# Patient Record
Sex: Female | Born: 1990 | Race: White | Hispanic: No | Marital: Single | Smoking: Never smoker
Health system: Southern US, Community
[De-identification: ages and names within clinical notes are randomized; demographics above are authoritative.]

## PROBLEM LIST (undated history)

## (undated) DIAGNOSIS — E669 Obesity, unspecified: Secondary | ICD-10-CM

## (undated) DIAGNOSIS — T7840XA Allergy, unspecified, initial encounter: Secondary | ICD-10-CM

---
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
## 2012-01-04 ENCOUNTER — Telehealth: Payer: Self-pay

## 2012-01-04 NOTE — Telephone Encounter (Signed)
LM for pt. To cb re: the message she left yesterday. JO CMA

## 2012-01-06 ENCOUNTER — Telehealth: Payer: Self-pay

## 2012-01-06 NOTE — Telephone Encounter (Signed)
Lm on given # [phone] for pt to call back re: message she left on 12/31/2011. As of 01/06/2012 I have not heard back from her. JO ,CMA.

## 2014-07-23 ENCOUNTER — Encounter (HOSPITAL_COMMUNITY): Payer: Self-pay | Admitting: *Deleted

## 2015-12-09 ENCOUNTER — Encounter: Payer: Self-pay | Admitting: Internal Medicine

## 2015-12-09 ENCOUNTER — Encounter (INDEPENDENT_AMBULATORY_CARE_PROVIDER_SITE_OTHER): Payer: Self-pay

## 2015-12-09 ENCOUNTER — Ambulatory Visit (INDEPENDENT_AMBULATORY_CARE_PROVIDER_SITE_OTHER): Payer: 59 | Admitting: Physician Assistant

## 2015-12-09 VITALS — BP 112/80 | HR 72 | Temp 97.3°F | Resp 16 | Ht 60.0 in | Wt 238.8 lb

## 2015-12-09 DIAGNOSIS — Z13220 Encounter for screening for lipoid disorders: Secondary | ICD-10-CM

## 2015-12-09 DIAGNOSIS — Z79899 Other long term (current) drug therapy: Secondary | ICD-10-CM

## 2015-12-09 DIAGNOSIS — E559 Vitamin D deficiency, unspecified: Secondary | ICD-10-CM

## 2015-12-09 DIAGNOSIS — D649 Anemia, unspecified: Secondary | ICD-10-CM

## 2015-12-09 DIAGNOSIS — Z1389 Encounter for screening for other disorder: Secondary | ICD-10-CM

## 2015-12-09 DIAGNOSIS — Z0000 Encounter for general adult medical examination without abnormal findings: Secondary | ICD-10-CM

## 2015-12-09 DIAGNOSIS — Z131 Encounter for screening for diabetes mellitus: Secondary | ICD-10-CM

## 2015-12-09 DIAGNOSIS — E669 Obesity, unspecified: Secondary | ICD-10-CM

## 2015-12-09 DIAGNOSIS — T7840XA Allergy, unspecified, initial encounter: Secondary | ICD-10-CM | POA: Insufficient documentation

## 2015-12-09 DIAGNOSIS — T7840XD Allergy, unspecified, subsequent encounter: Secondary | ICD-10-CM

## 2015-12-09 DIAGNOSIS — Z87440 Personal history of urinary (tract) infections: Secondary | ICD-10-CM

## 2015-12-09 LAB — CBC WITH DIFFERENTIAL/PLATELET
BASOS ABS: 0 10*3/uL (ref 0.0–0.1)
Basophils Relative: 0 % (ref 0–1)
EOS PCT: 4 % (ref 0–5)
Eosinophils Absolute: 0.4 10*3/uL (ref 0.0–0.7)
HEMATOCRIT: 38.6 % (ref 36.0–46.0)
HEMOGLOBIN: 12.2 g/dL (ref 12.0–15.0)
LYMPHS ABS: 3.6 10*3/uL (ref 0.7–4.0)
LYMPHS PCT: 37 % (ref 12–46)
MCH: 23.5 pg — ABNORMAL LOW (ref 26.0–34.0)
MCHC: 31.6 g/dL (ref 30.0–36.0)
MCV: 74.4 fL — AB (ref 78.0–100.0)
MONOS PCT: 6 % (ref 3–12)
MPV: 8.2 fL — ABNORMAL LOW (ref 8.6–12.4)
Monocytes Absolute: 0.6 10*3/uL (ref 0.1–1.0)
NEUTROS ABS: 5.1 10*3/uL (ref 1.7–7.7)
Neutrophils Relative %: 53 % (ref 43–77)
Platelets: 411 10*3/uL — ABNORMAL HIGH (ref 150–400)
RBC: 5.19 MIL/uL — ABNORMAL HIGH (ref 3.87–5.11)
RDW: 15.6 % — ABNORMAL HIGH (ref 11.5–15.5)
WBC: 9.7 10*3/uL (ref 4.0–10.5)

## 2015-12-09 LAB — HEMOGLOBIN A1C
HEMOGLOBIN A1C: 6 % — AB (ref <5.7)
Mean Plasma Glucose: 126 mg/dL — ABNORMAL HIGH (ref <117)

## 2015-12-09 NOTE — Patient Instructions (Signed)
We want weight loss that will last so you should lose 1-2 pounds a week.  THAT IS IT! Please pick THREE things a month to change. Once it is a habit check off the item. Then pick another three items off the list to become habits.  If you are already doing a habit on the list GREAT!  Cross that item off! o Don't drink your calories. Ie, alcohol, soda, fruit juice, and sweet tea.  o Drink more water. Drink a glass when you feel hungry or before each meal.  o Eat breakfast - Complex carb and protein (likeDannon light and fit yogurt, oatmeal, fruit, eggs, [place] bacon). o Measure your cereal.  Eat no more than one cup a day. (ie [place]) o Eat an apple a day. o Add a vegetable a day. o Try a new vegetable a month. o Use Pam! Stop using oil or butter to cook. o Don't finish your plate or use smaller plates. o Share your dessert. o Eat sugar free Jello for dessert or frozen grapes. o Don't eat 2-3 hours before bed. o Switch to whole wheat bread, pasta, and brown rice. o Make healthier choices when you eat out. No fries! o Pick baked chicken, NOT fried. o Don't forget to SLOW DOWN when you eat. It is not going anywhere.  o Take the stairs. o Park far away in the parking lot o [employer] (or weights) for 10 minutes while watching TV. o Walk at work for 10 minutes during break. o Walk outside 1 time a week with your friend, kids, dog, or significant other. o Start a walking group at church. o Walk the mall as much as you can tolerate.  o Keep a food diary. o Weigh yourself daily. o Walk for 15 minutes 3 days per week. o Cook at home more often and eat out less.  If life happens and you go back to old habits, it is okay.  Just start over. You can do it!   If you experience chest pain, get short of breath, or tired during the exercise, please stop immediately and inform your doctor.      Bad carbs also include fruit juice, alcohol, and sweet tea. These are empty calories that do not signal  to your brain that you are full.   Please remember the good carbs are still carbs which convert into sugar. So please measure them out no more than 1/2-1 cup of rice, oatmeal, pasta, and beans  Veggies are however free foods! Pile them on.   Not all fruit is created equal. Please see the list below, the fruit at the bottom is higher in sugars than the fruit at the top. Please avoid all dried fruits.    Before you even begin to attack a weight-loss plan, it pays to remember this: You are not fat. You have fat. Losing weight isn't about blame or shame; it's simply another achievement to accomplish. Dieting is like any other skill-you have to buckle down and work at it. As long as you act in a smart, reasonable way, you'll ultimately get where you want to be. Here are some weight loss pearls for you.  1. It's Not a Diet. It's a Lifestyle Thinking of a diet as something you're on and suffering through only for the short term doesn't work. To shed weight and keep it off, you need to make permanent changes to the way you eat. It's OK to indulge occasionally, of course, but if  you cut calories temporarily and then revert to your old way of eating, you'll gain back the weight quicker than you can say yo-yo. Use it to lose it. Research shows that one of the best predictors of long-term weight loss is how many pounds you drop in the first month. For that reason, nutritionists often suggest being stricter for the first two weeks of your new eating strategy to build momentum. Cut out added sugar and alcohol and avoid unrefined carbs. After that, figure out how you can reincorporate them in a way that's healthy and maintainable.  2. There's a Right Way to Exercise Working out burns calories and fat and boosts your metabolism by building muscle. But those trying to lose weight are notorious for overestimating the number of calories they burn and underestimating the amount they take in. Unfortunately, your system is  biologically programmed to hold on to extra pounds and that means when you start exercising, your body senses the deficit and ramps up its hunger signals. If you're not diligent, you'll eat everything you burn and then some. Use it to lose it. Cardio gets all the exercise glory, but strength and interval training are the real heroes. They help you build lean muscle, which in turn increases your metabolism and calorie-burning ability 3. Don't Overreact to Mild Hunger Some people have a hard time losing weight because of hunger anxiety. To them, being hungry is bad-something to be avoided at all costs-so they carry snacks with them and eat when they don't need to. Others eat because they're stressed out or bored. While you never want to get to the point of being ravenous (that's when bingeing is likely to happen), a hunger pang, a craving, or the fact that it's 3:00 p.m. should not send you racing for the vending machine or obsessing about the energy bar in your purse. Ideally, you should put off eating until your stomach is growling and it's difficult to concentrate.  Use it to lose it. When you feel the urge to eat, use the HALT method. Ask yourself, Am I really hungry? Or am I angry or anxious, lonely or bored, or tired? If you're still not certain, try the apple test. If you're truly hungry, an apple should seem delicious; if it doesn't, something else is going on. Or you can try drinking water and making yourself busy, if you are still hungry try a healthy snack.  4. Not All Calories Are Created Equal The mechanics of weight loss are pretty simple: Take in fewer calories than you use for energy. But the kind of food you eat makes all the difference. Processed food that's high in saturated fat and refined starch or sugar can cause inflammation that disrupts the hormone signals that tell your brain you're full. The result: You eat a lot more.  Use it to lose it. Clean up your diet. Swap in whole, unprocessed  foods, including vegetables, lean protein, and healthy fats that will fill you up and give you the biggest nutritional bang for your calorie buck. In a few weeks, as your brain starts receiving regular hunger and fullness signals once again, you'll notice that you feel less hungry overall and naturally start cutting back on the amount you eat.  5. Protein, Produce, and Plant-Based Fats Are Your Weight-Loss Trinity Here's why eating the three Ps regularly will help you drop pounds. Protein fills you up. You need it to build lean muscle, which keeps your metabolism humming so that you can torch  more fat. People in a weight-loss program who ate double the recommended daily allowance for protein (about 110 grams for a 150-pound woman) lost 70 percent of their weight from fat, while people who ate the RDA lost only about 40 percent, one study found. Produce is packed with filling fiber. "It's very difficult to consume too many calories if you're eating a lot of vegetables. Example: Three cups of broccoli is a lot of food, yet only 93 calories. (Fruit is another story. It can be easy to overeat and can contain a lot of calories from sugar, so be sure to monitor your intake.) Plant-based fats like olive oil and those in avocados and nuts are healthy and extra satiating.  Use it to lose it. Aim to incorporate each of the three Ps into every meal and snack. People who eat protein throughout the day are able to keep weight off, according to a study in the American Journal of Clinical Nutrition. In addition to meat, poultry and seafood, good sources are beans, lentils, eggs, tofu, and yogurt. As for fat, keep portion sizes in check by measuring out salad dressing, oil, and nut butters (shoot for one to two tablespoons). Finally, eat veggies or a little fruit at every meal. People who did that consumed 308 fewer calories but didn't feel any hungrier than when they didn't eat more produce.  7. How You Eat Is As Important  As What You Eat In order for your brain to register that you're full, you need to focus on what you're eating. Sit down whenever you eat, preferably at a table. Turn off the TV or computer, put down your phone, and look at your food. Smell it. Chew slowly, and don't put another bite on your fork until you swallow. When women ate lunch this attentively, they consumed 30 percent less when snacking later than those who listened to an audiobook at lunchtime, according to a study in the [place] Journal of Nutrition. 8. Weighing Yourself Really Works The scale provides the best evidence about whether your efforts are paying off. Seeing the numbers tick up or down or stagnate is motivation to keep going-or to rethink your approach. A 2015 study at [facility] found that daily weigh-ins helped people lose more weight, keep it off, and maintain that loss, even after two years. Use it to lose it. Step on the scale at the same time every day for the best results. If your weight shoots up several pounds from one weigh-in to the next, don't freak out. Eating a lot of salt the night before or having your period is the likely culprit. The number should return to normal in a day or two. It's a steady climb that you need to do something about. 9. Too Much Stress and Too Little Sleep Are Your Enemies When you're tired and frazzled, your body cranks up the production of cortisol, the stress hormone that can cause carb cravings. Not getting enough sleep also boosts your levels of ghrelin, a hormone associated with hunger, while suppressing leptin, a hormone that signals fullness and satiety. People on a diet who slept only five and a half hours a night for two weeks lost 55 percent less fat and were hungrier than those who slept eight and a half hours, according to a study in the [place] [employer] Journal. Use it to lose it. Prioritize sleep, aiming for seven hours or more a night, which research shows helps  lower stress. And make sure you're getting  quality zzz's. If a snoring spouse or a fidgety cat wakes you up frequently throughout the night, you may end up getting the equivalent of just four hours of sleep, according to a study from [facility]. Keep pets out of the bedroom, and use a white-noise app to drown out snoring. 10. You Will Hit a plateau-And You Can Bust Through It As you slim down, your body releases much less leptin, the fullness hormone.  If you're not strength training, start right now. Building muscle can raise your metabolism to help you overcome a plateau. To keep your body challenged and burning calories, incorporate new moves and more intense intervals into your workouts or add another sweat session to your weekly routine. Alternatively, cut an extra 100 calories or so a day from your diet. Now that you've lost weight, your body simply doesn't need as much fuel.   Ways to cut 100 calories  1. Eat your eggs with hot sauce OR salsa instead of cheese.  Eggs are great for breakfast, but many people consider eggs and cheese to be BFFs. Instead of cheese-1 oz. of cheddar has 114 calories-top your eggs with hot sauce, which contains no calories and helps with satiety and metabolism. Salsa is also a great option!!  2. Top your toast, waffles or pancakes with mashed berries instead of jelly or syrup. Half a cup of berries-fresh, frozen or thawed-has about 40 calories, compared with 2 tbsp. of maple syrup or jelly, which both have about 100 calories. The berries will also give you a good punch of fiber, which helps keep you full and satisfied and won't spike blood sugar quickly like the jelly or syrup. 3. Swap the non-fat latte for black coffee with a splash of half-and-half. Contrary to its name, that non-fat latte has 130 calories and a startling 19g of carbohydrates per 16 oz. serving. Replacing that 'light' drinkable dessert with a black coffee with a splash of half-and-half saves  you more than 100 calories per 16 oz. serving. 4. Sprinkle salads with freeze-dried raspberries instead of dried cranberries. If you want a sweet addition to your nutritious salad, stay away from dried cranberries. They have a whopping 130 calories per  cup and 30g carbohydrates. Instead, sprinkle freeze-dried raspberries guilt-free and save more than 100 calories per  cup serving, adding 3g of belly-filling fiber. 5. Go for mustard in place of mayo on your sandwich. Mustard can add really nice flavor to any sandwich, and there are tons of varieties, from spicy to honey. A serving of mayo is 95 calories, versus 10 calories in a serving of mustard. 6. Choose a DIY salad dressing instead of the store-bought kind. Mix Dijon or whole grain mustard with low-fat Kefir or red wine vinegar and garlic. 7. Use hummus as a spread instead of a dip. Use hummus as a spread on a high-fiber cracker or tortilla with a sandwich and save on calories without sacrificing taste. 8. Pick just one salad "accessory." Salad isn't automatically a calorie winner. It's easy to over-accessorize with toppings. Instead of topping your salad with nuts, avocado and cranberries (all three will clock in at 313 calories), just pick one. The next day, choose a different accessory, which will also keep your salad interesting. You don't wear all your jewelry every day, right? 9. Ditch the white pasta in favor of spaghetti squash. One cup of cooked spaghetti squash has about 40 calories, compared with traditional spaghetti, which comes with more than 200. Spaghetti squash is  also nutrient-dense. It's a good source of fiber and Vitamins A and C, and it can be eaten just like you would eat pasta-with a great tomato sauce and [place] meatballs or with pesto, tofu and spinach, for example. 10. Dress up your chili, soups and stews with non-fat [place] yogurt instead of sour cream. Just a 'dollop' of sour cream can set you back 115 calories and a  whopping 12g of fat-seven of which are of the artery-clogging variety. Added bonus: [place] yogurt is packed with muscle-building protein, calcium and B Vitamins. 11. Mash cauliflower instead of mashed potatoes. One cup of traditional mashed potatoes-in all their creamy goodness-has more than 200 calories, compared to mashed cauliflower, which you can typically eat for less than 100 calories per 1 cup serving. Cauliflower is a great source of the antioxidant indole-3-carbinol (I3C), which may help reduce the risk of some cancers, like breast cancer. 12. Ditch the ice cream sundae in favor of a [place] yogurt parfait. Instead of a cup of ice cream or fro-yo for dessert, try 1 cup of nonfat Greek yogurt topped with fresh berries and a sprinkle of cacao nibs. Both toppings are packed with antioxidants, which can help reduce cellular inflammation and oxidative damage. And the comparison is a no-brainer: One cup of ice cream has about 275 calories; one cup of frozen yogurt has about 230; and a cup of Greek yogurt has just 130, plus twice the protein, so you're less likely to return to the freezer for a second helping. 13. Put olive oil in a spray container instead of using it directly from the bottle. Each tablespoon of olive oil is 120 calories and 15g of fat. Use a mister instead of pouring it straight into the pan or onto a salad. This allows for portion control and will save you more than 100 calories. 14. When baking, substitute canned pumpkin for butter or oil. Canned pumpkin-not pumpkin pie mix-is loaded with Vitamin A, which is important for skin and eye health, as well as immunity. And the comparisons are pretty crazy:  cup of canned pumpkin has about 40 calories, compared to butter or oil, which has more than 800 calories. Yes, 800 calories. Applesauce and mashed banana can also serve as good substitutions for butter or oil, usually in a 1:1 ratio. 15. Top casseroles with high-fiber cereal instead of  breadcrumbs. Breadcrumbs are typically made with white bread, while breakfast cereals contain 5-9g of fiber per serving. Not only will you save more than 150 calories per  cup serving, the swap will also keep you more full and you'll get a metabolism boost from the added fiber. 16. Snack on pistachios instead of macadamia nuts. Believe it or not, you get the same amount of calories from 35 pistachios (100 calories) as you would from only five macadamia nuts. 17. Chow down on kale chips rather than potato chips. This is my favorite 'don't knock it 'till you try it' swap. Kale chips are so easy to make at home, and you can spice them up with a little grated parmesan or chili powder. Plus, they're a mere fraction of the calories of potato chips, but with the same crunch factor we crave so often. 18. Add seltzer and some fruit slices to your cocktail instead of soda or fruit juice. One cup of soda or fruit juice can pack on as much as 140 calories. Instead, use seltzer and fruit slices. The fruit provides valuable phytochemicals, such as flavonoids and anthocyanins, which help to  combat cancer and stave off the aging process.

## 2015-12-09 NOTE — Progress Notes (Signed)
Complete Physical  Assessment and Plan: 1. Obesity Obesity with co morbidities- long discussion about weight loss, diet, and exercise  2. Allergy, subsequent encounter  3. Screening for blood or protein in urine - Microalbumin / creatinine urine ratio  4. Screening cholesterol level - Lipid panel  5. Screening for diabetes mellitus - Hemoglobin A1c - Insulin, fasting  6. History of UTI - Urinalysis, Routine w reflex microscopic ([facility]) - Urine culture  7. Medication management - CBC with Differential/Platelet - BASIC METABOLIC PANEL WITH GFR - TSH - Hepatic function panel - Magnesium  8. Vitamin D deficiency - VITAMIN D 25 Hydroxy (Vit-D Deficiency, Fractures)  9. Anemia, unspecified anemia type - Iron and TIBC - Ferritin - Vitamin B12  . Discussed med's effects and SE's. Screening labs and tests as requested with regular follow-up as recommended. Over 40 minutes of exam, counseling, chart review and critical decision making was performed  HPI  This very nice 24 y.o.female presents for complete physical.  Patient has no major health issues.  Patient reports no complaints at this time.  She will be moving to [place] on the 14th, boyfriend lives there, father of isabella, together x 6 years.  Was on BCP, but is causing irregular menses, lasting very long and heavy menses, following with Dr. Senaida Oresichardson.  X child in Oct 2012, girl, isabella.  She does workout.  Was seen at [facility] for UTI in Jan, no symptoms at this time.  BMI is Body mass index is 46.64 kg/(m^2)., she is working on diet and exercise. Wt Readings from Last 3 Encounters:  12/09/15 238 lb 12.8 oz (108.319 kg)  06/23/11 196 lb (88.905 kg) (97 %*, Z = 1.93)   * Growth percentiles are based on CDC 2-20 Years data.     Current Medications:  No current outpatient prescriptions on file prior to visit.   No current facility-administered medications on file prior to visit.   Health Maintenance:    TD/TDAP: unknown- will check Influenza:  Pneumovax: N/A Prevnar 13: N/A HPV: never but willing to get  LMP: No LMP recorded. Sexually Active: yes STD testing offered Pap: 2016 at [facility], Dr. Roxan Hockeyobinson [facility]:  N/A Last Dental Exam: Last Eye Exam:  Allergies: No Known Allergies Medical History:  Past Medical History  Diagnosis Date  . Asthma   . Obesity   . Allergy    Surgical History:  Past Surgical History  Procedure Laterality Date  . Abcess tooth  2010  . Wisdom teeth  2009  . Cholecystectomy  10/2011   Family History:  Family History  Problem Relation Age of Onset  . Adopted: Yes   Social History:  Social History  Substance Use Topics  . Smoking status: Never Smoker   . Smokeless tobacco: Never Used  . Alcohol Use: 0.0 oz/week    0 Standard drinks or equivalent per week     Comment: once a month   Review of Systems: Review of Systems  Constitutional: Negative.   HENT: Negative.   Eyes: Negative.   Respiratory: Negative.   Cardiovascular: Negative.   Gastrointestinal: Negative.   Genitourinary: Negative.   Musculoskeletal: Negative.   Skin: Negative.   Neurological: Negative.   Endo/Heme/Allergies: Negative.   Psychiatric/Behavioral: Negative.     Physical Exam: Estimated body mass index is 46.64 kg/(m^2) as calculated from the following:   Height as of this encounter: 5' (1.524 m).   Weight as of this encounter: 238 lb 12.8 oz (108.319 kg). BP 112/80 mmHg  Pulse 72  Temp(Src) 97.3 F (36.3 C)  Resp 16  Ht 5' (1.524 m)  Wt 238 lb 12.8 oz (108.319 kg)  BMI 46.64 kg/m2 General Appearance: Well nourished, in no apparent distress.  Eyes: PERRLA, EOMs, conjunctiva no swelling or erythema, normal fundi and vessels.  Sinuses: No Frontal/maxillary tenderness  ENT/Mouth: Ext aud canals clear, normal light reflex with TMs without erythema, bulging. Good dentition. No erythema, swelling, or exudate on post pharynx. Tonsils not swollen or erythematous.  Hearing normal.  Neck: Supple, thyroid normal. No bruits  Respiratory: Respiratory effort normal, BS equal bilaterally without rales, rhonchi, wheezing or stridor.  Cardio: RRR without murmurs, rubs or gallops. Brisk peripheral pulses without edema.  Chest: symmetric, with normal excursions and percussion.  Breasts: defer  Abdomen: Soft, nontender, obese, no guarding, rebound, hernias, masses, or organomegaly.  Lymphatics: Non tender without lymphadenopathy.  Genitourinary: defer Musculoskeletal: Full ROM all peripheral extremities,5/5 strength, and normal gait.  Skin: Warm, dry without rashes, lesions, ecchymosis. Neuro: Cranial nerves intact, reflexes equal bilaterally. Normal muscle tone, no cerebellar symptoms. Sensation intact.  Psych: Awake and oriented X 3, normal affect, Insight and Judgment appropriate.   EKG: defer  Quentin Mulling 12:37 PM [facility]

## 2015-12-10 LAB — LIPID PANEL
Cholesterol: 197 mg/dL (ref 125–200)
HDL: 55 mg/dL (ref >=46)
LDL CALC: 123 mg/dL (ref <130)
Total CHOL/HDL Ratio: 3.6 Ratio (ref <=5.0)
Triglycerides: 97 mg/dL (ref <150)
VLDL: 19 mg/dL (ref <30)

## 2015-12-10 LAB — VITAMIN B12: Vitamin B-12: 584 pg/mL (ref 200–1100)

## 2015-12-10 LAB — MICROALBUMIN / CREATININE URINE RATIO
Creatinine, Urine: 184 mg/dL (ref 20–320)
Microalb Creat Ratio: 3 mcg/mg creat (ref <30)
Microalb, Ur: 0.6 mg/dL

## 2015-12-10 LAB — URINALYSIS, ROUTINE W REFLEX MICROSCOPIC
BILIRUBIN URINE: NEGATIVE
GLUCOSE, UA: NEGATIVE
HGB URINE DIPSTICK: NEGATIVE
Ketones, ur: NEGATIVE
Nitrite: NEGATIVE
PH: 5.5 (ref 5.0–8.0)
PROTEIN: NEGATIVE
Specific Gravity, Urine: 1.028 (ref 1.001–1.035)

## 2015-12-10 LAB — URINE CULTURE

## 2015-12-10 LAB — URINALYSIS, MICROSCOPIC ONLY
CRYSTALS: NONE SEEN [HPF]
Casts: NONE SEEN [LPF]
YEAST: NONE SEEN [HPF]

## 2015-12-10 LAB — IRON AND TIBC
%SAT: 11 % (ref 11–50)
IRON: 39 ug/dL — AB (ref 40–190)
TIBC: 357 ug/dL (ref 250–450)
UIBC: 318 ug/dL (ref 125–400)

## 2015-12-10 LAB — HEPATIC FUNCTION PANEL
ALK PHOS: 91 U/L (ref 33–115)
ALT: 17 U/L (ref 6–29)
AST: 17 U/L (ref 10–30)
Albumin: 4 g/dL (ref 3.6–5.1)
BILIRUBIN DIRECT: 0.1 mg/dL (ref <=0.2)
BILIRUBIN INDIRECT: 0.2 mg/dL (ref 0.2–1.2)
TOTAL PROTEIN: 7.3 g/dL (ref 6.1–8.1)
Total Bilirubin: 0.3 mg/dL (ref 0.2–1.2)

## 2015-12-10 LAB — BASIC METABOLIC PANEL WITH GFR
BUN: 13 mg/dL (ref 7–25)
CHLORIDE: 104 mmol/L (ref 98–110)
CO2: 23 mmol/L (ref 20–31)
Calcium: 9.5 mg/dL (ref 8.6–10.2)
Creat: 0.6 mg/dL (ref 0.50–1.10)
GFR, Est African American: >89 mL/min (ref >=60)
GFR, Est Non African American: >89 mL/min (ref >=60)
GLUCOSE: 89 mg/dL (ref 65–99)
POTASSIUM: 4.4 mmol/L (ref 3.5–5.3)
Sodium: 136 mmol/L (ref 135–146)

## 2015-12-10 LAB — FERRITIN: FERRITIN: 13 ng/mL (ref 10–154)

## 2015-12-10 LAB — INSULIN, FASTING: Insulin fasting, serum: 18 u[IU]/mL (ref 2.0–19.6)

## 2015-12-10 LAB — MAGNESIUM: MAGNESIUM: 2 mg/dL (ref 1.5–2.5)

## 2015-12-10 LAB — VITAMIN D 25 HYDROXY (VIT D DEFICIENCY, FRACTURES): VIT D 25 HYDROXY: 12 ng/mL — AB (ref 30–100)

## 2015-12-10 LAB — TSH: TSH: 2.47 mIU/L

## 2016-09-13 ENCOUNTER — Emergency Department (HOSPITAL_COMMUNITY)
Admission: EM | Admit: 2016-09-13 | Discharge: 2016-09-13 | Disposition: A | Discharge status: Home / Self Care | Payer: BLUE CROSS/BLUE SHIELD | Attending: Emergency Medicine | Admitting: Emergency Medicine

## 2016-09-13 ENCOUNTER — Encounter (HOSPITAL_COMMUNITY): Payer: Self-pay

## 2016-09-13 ENCOUNTER — Emergency Department (HOSPITAL_COMMUNITY): Payer: BLUE CROSS/BLUE SHIELD

## 2016-09-13 DIAGNOSIS — R002 Palpitations: Secondary | ICD-10-CM | POA: Diagnosis present

## 2016-09-13 DIAGNOSIS — F410 Panic disorder [episodic paroxysmal anxiety] without agoraphobia: Secondary | ICD-10-CM | POA: Diagnosis not present

## 2016-09-13 DIAGNOSIS — J45909 Unspecified asthma, uncomplicated: Secondary | ICD-10-CM | POA: Diagnosis not present

## 2016-09-13 LAB — I-STAT CHEM 8, ED
BUN: 15 mg/dL (ref 6–20)
CALCIUM ION: 1.19 mmol/L (ref 1.15–1.40)
CHLORIDE: 105 mmol/L (ref 101–111)
CREATININE: 0.7 mg/dL (ref 0.44–1.00)
GLUCOSE: 109 mg/dL — AB (ref 65–99)
HCT: 32 % — ABNORMAL LOW (ref 36.0–46.0)
Hemoglobin: 10.9 g/dL — ABNORMAL LOW (ref 12.0–15.0)
Potassium: 3.7 mmol/L (ref 3.5–5.1)
SODIUM: 142 mmol/L (ref 135–145)
TCO2: 24 mmol/L (ref 0–100)

## 2016-09-13 LAB — I-STAT TROPONIN, ED: TROPONIN I, POC: 0 ng/mL (ref 0.00–0.08)

## 2016-09-13 NOTE — ED Notes (Signed)
Patient transported to X-ray 

## 2016-09-13 NOTE — ED Triage Notes (Signed)
Pt complaining of mid chest pressure and SOB. Pt states ongoing x 2 days. Pt states feels like heart is beating fast.

## 2016-09-13 NOTE — ED Provider Notes (Signed)
[facility] DEPT Provider Note   CSN: [id number] Arrival date & time: 09/13/16  2138     History   Chief Complaint Chief Complaint  Patient presents with  . Chest Pain  . Shortness of Breath    HPI Teresa Summers is a 25 y.o. female.  The history is provided by the patient.  Palpitations   This is a new problem. The current episode started yesterday. The problem occurs daily. The problem has been resolved. The problem is associated with anxiety (chest tightness). On average, each episode lasts 30 seconds. Pertinent negatives include no near-syncope. Associated symptoms comments: shakiness. She has tried nothing for the symptoms. Risk factors include stress.    Past Medical History:  Diagnosis Date  . Allergy   . Asthma   . Obesity     Patient Active Problem List   Diagnosis Date Noted  . Obesity   . Allergy     Past Surgical History:  Procedure Laterality Date  . abcess tooth  2010  . CHOLECYSTECTOMY  10/2011  . wisdom teeth  2009    OB History    Gravida Para Term Preterm AB Living   1 1 1     1    SAB TAB Ectopic Multiple Live Births           1       Home Medications    Prior to Admission medications   Not on File    Family History Family History  Problem Relation Age of Onset  . Adopted: Yes    Social History Social History  Substance Use Topics  . Smoking status: Never Smoker  . Smokeless tobacco: Never Used  . Alcohol use 0.0 oz/week     Comment: once a month     Allergies   Patient has no known allergies.   Review of Systems Review of Systems  Cardiovascular: Positive for palpitations. Negative for near-syncope.  All other systems reviewed and are negative.    Physical Exam Updated Vital Signs BP 120/79   Pulse 105   Temp 98 F (36.7 C) (Oral)   Resp 22   LMP 09/04/2016 (Approximate)   SpO2 100%   Physical Exam  Constitutional: She is oriented to person, place, and time. She appears well-developed and  well-nourished. No distress.  HENT:  Head: Normocephalic.  Nose: Nose normal.  Eyes: Conjunctivae are normal.  Neck: Neck supple. No tracheal deviation present.  Cardiovascular: Normal rate, regular rhythm and normal heart sounds.   Pulmonary/Chest: Effort normal and breath sounds normal. No respiratory distress.  Abdominal: Soft. She exhibits no distension. There is no tenderness.  Neurological: She is alert and oriented to person, place, and time.  Skin: Skin is warm and dry. Capillary refill takes less than 2 seconds.  Psychiatric: She has a normal mood and affect.     ED Treatments / Results  Labs (all labs ordered are listed, but only abnormal results are displayed) Labs Reviewed  Rosezena SensorI-STAT TROPOININ, ED  I-STAT CHEM 8, ED    EKG  EKG Interpretation  Date/Time:  Sunday September 13 2016 21:43:38 EST Ventricular Rate:  107 PR Interval:  160 QRS Duration: 76 QT Interval:  352 QTC Calculation: 469 R Axis:   55 Text Interpretation:  Sinus tachycardia Otherwise normal ECG Confirmed by Kaylib Furness MD, Alajah Witman [phone]([id number]) on 09/13/2016 9:59:47 PM       Radiology Dg Chest 2 View  Result Date: 09/13/2016 CLINICAL DATA:  Mid chest pain and pressure. EXAM: CHEST  2  VIEW COMPARISON:  None. FINDINGS: The heart size and mediastinal contours are within normal limits. Both lungs are clear. The visualized skeletal structures are unremarkable. IMPRESSION: No active cardiopulmonary disease. Electronically Signed   By: Ted Mcalpineobrinka  Dimitrova M.D.   On: 09/13/2016 22:28    Procedures Procedures (including critical care time)  Medications Ordered in ED Medications - No data to display   Initial Impression / Assessment and Plan / ED Course  I have reviewed the triage vital signs and the nursing notes.  Pertinent labs & imaging results that were available during my care of the patient were reviewed by me and considered in my medical decision making (see chart for details).  Clinical Course      25 y.o. female presents with palpitation feeling and episodes of chest tightness over the last 2 days intermittently. She states she has been under increased stress after breaking up with her significant other and baby's father prior to the holidays. She became emotional and cried during exam when discussing this and had recurrent symptoms on monitoring without arrhythmia or significant tachycardia. Discussed outlets for stress and therapy/PCP f/u for considering medications. Plan to follow up with PCP as needed and return precautions discussed for worsening or new concerning symptoms.   Final Clinical Impressions(s) / ED Diagnoses   Final diagnoses:  Panic attack  Palpitations    New Prescriptions New Prescriptions   No medications on file     Lyndal Pulleyaniel Orville Mena, MD 09/14/16 0100

## 2016-09-17 ENCOUNTER — Ambulatory Visit: Payer: Self-pay | Admitting: Internal Medicine

## 2016-09-22 ENCOUNTER — Ambulatory Visit (INDEPENDENT_AMBULATORY_CARE_PROVIDER_SITE_OTHER): Payer: 59 | Admitting: Internal Medicine

## 2016-09-22 ENCOUNTER — Encounter: Payer: Self-pay | Admitting: Internal Medicine

## 2016-09-22 VITALS — BP 118/86 | HR 88 | Temp 97.3°F | Resp 16 | Ht 60.0 in | Wt 238.2 lb

## 2016-09-22 DIAGNOSIS — F410 Panic disorder [episodic paroxysmal anxiety] without agoraphobia: Secondary | ICD-10-CM | POA: Diagnosis not present

## 2016-09-22 MED ORDER — ALPRAZOLAM 0.5 MG PO TABS: ORAL_TABLET | ORAL | 0 refills | Status: AC

## 2016-09-22 NOTE — Progress Notes (Signed)
  [facility]   Lucky CowboyWilliam Arnol Mcgibbon, M.D.    Dyanne CarrelAmanda R. Steffanie Dunnollier, P.A.-C      Terri Piedraourtney Forcucci, P.A.-C  [facility]                [address]                [place], [place] [id number] Telephone [phone]  Subjective:    Patient ID: Teresa KaufmannShaunna Steadham, female    DOB: 07/18/1991, 26 y.o.   MRN: [id number]  HPI  This very nice 26 yo SWF apparently has had problems with anxiety as a single parent odf a 275 yo daughter and over the course of the the day before Xmas eve had increasing anxiety culminating in a panic episode and she went to and was evaluated at the ER and dx'd with a Panic Attack and released. She relates intermittent anxiety otherwise, but never so intense as this episode. Denies feelings of depression or SI or delusions.    Meds - none  All- None  Past Medical History:  Diagnosis Date  . Allergy   . Asthma   . Obesity    Review of Systems  10 point systems review negative except as above.    Objective:   Physical Exam  BP 118/86   Pulse 88   Temp 97.3 F (36.3 C)   Resp 16   Ht 5' (1.524 m)   Wt 238 lb 3.2 oz (108 kg)   LMP 09/04/2016 (Approximate)   BMI 46.52 kg/m   HEENT - SWNL. Neck - supple. Nl Thyroid.  Chest - Clear. Cor - Nl HS. RRR w/o sig M. PP Nl. MS- FROM w/o deformities. Muscle power, tone and bulk Nl. Gait Nl. Neuro - Nl w/o focal abnormalities. Psyche - Mental status normal & appropriate now.  No delusions, ideations or obvious mood abnormalities.    Assessment & Plan:   1. Panic attacks  - Discussed anxiety and panic episodes with written info.   - ALPRAZolam (XANAX) 0.5 MG tablet; Take 1/2 to 1 tablet every 4 hours if needed for Panic Attacks  Dispense: 30 tablet; Refill: 0  - Declined need for counseling at this time.  - discussed meds and SE's.

## 2016-09-22 NOTE — Patient Instructions (Addendum)
Generalized Anxiety Disorder Generalized anxiety disorder (GAD) is a mental disorder. It interferes with life functions, including relationships, work, and school. GAD is different from normal anxiety, which everyone experiences at some point in their lives in response to specific life events and activities. Normal anxiety actually helps [place] prepare for and get through these life events and activities. Normal anxiety goes away after the event or activity is over.  GAD causes anxiety that is not necessarily related to specific events or activities. It also causes excess anxiety in proportion to specific events or activities. The anxiety associated with GAD is also difficult to control. GAD can vary from mild to severe. People with severe GAD can have intense waves of anxiety with physical symptoms (panic attacks).  SYMPTOMS The anxiety and worry associated with GAD are difficult to control. This anxiety and worry are related to many life events and activities and also occur more days than not for 6 months or longer. People with GAD also have three or more of the following symptoms (one or more in children):  Restlessness.   Fatigue.  Difficulty concentrating.   Irritability.  Muscle tension.  Difficulty sleeping or unsatisfying sleep. DIAGNOSIS GAD is diagnosed through an assessment by your health care provider. Your health care provider will ask you questions aboutyour mood,physical symptoms, and events in your life. Your health care provider may ask you about your medical history and use of alcohol or drugs, including prescription medicines. Your health care provider may also do a physical exam and blood tests. Certain medical conditions and the use of certain substances can cause symptoms similar to those associated with GAD. Your health care provider may refer you to a mental health specialist for further evaluation. TREATMENT The following therapies are usually used to treat GAD:    Medication. Antidepressant medication usually is prescribed for long-term daily control. Antianxiety medicines may be added in severe cases, especially when panic attacks occur.   Talk therapy (psychotherapy). Certain types of talk therapy can be helpful in treating GAD by providing support, education, and guidance. A form of talk therapy called cognitive behavioral therapy can teach you healthy ways to think about and react to daily life events and activities.  Stress managementtechniques. These include yoga, meditation, and exercise and can be very helpful when they are practiced regularly. A mental health specialist can help determine which treatment is best for you. Some people see improvement with one therapy. However, other people require a combination of therapies.         ++++++++++++++++++++++++++++   Panic Attacks Panic attacks are sudden, short-livedsurges of severe anxiety, fear, or discomfort. They may occur for no reason when you are relaxed, when you are anxious, or when you are sleeping. Panic attacks may occur for a number of reasons:  Healthy people occasionally have panic attacks in extreme, life-threatening situations, such as war or natural disasters. Normal anxiety is a protective mechanism of the body that helps [place] react to danger (fight or flight response).  Panic attacks are often seen with anxiety disorders, such as panic disorder, social anxiety disorder, generalized anxiety disorder, and phobias. Anxiety disorders cause excessive or uncontrollable anxiety. They may interfere with your relationships or other life activities.  Panic attacks are sometimes seen with other mental illnesses, such as depression and posttraumatic stress disorder.  Certain medical conditions, prescription medicines, and drugs of abuse can cause panic attacks. What are the signs or symptoms? Panic attacks start suddenly, peak within 20 minutes, and are accompanied  by four or more of  the following symptoms:  Pounding heart or fast heart rate (palpitations).  Sweating.  Trembling or shaking.  Shortness of breath or feeling smothered.  Feeling choked.  Chest pain or discomfort.  Nausea or strange feeling in your stomach.  Dizziness, light-headedness, or feeling like you will faint.  Chills or hot flushes.  Numbness or tingling in your lips or hands and feet.  Feeling that things are not real or feeling that you are not yourself.  Fear of losing control or going crazy.  Fear of dying. Some of these symptoms can mimic serious medical conditions. For example, you may think you are having a heart attack. Although panic attacks can be very scary, they are not life threatening. How is this diagnosed? Panic attacks are diagnosed through an assessment by your health care provider. Your health care provider will ask questions about your symptoms, such as where and when they occurred. Your health care provider will also ask about your medical history and use of alcohol and drugs, including prescription medicines. Your health care provider may order blood tests or other studies to rule out a serious medical condition. Your health care provider may refer you to a mental health professional for further evaluation. How is this treated?  Most healthy people who have one or two panic attacks in an extreme, life-threatening situation will not require treatment.  The treatment for panic attacks associated with anxiety disorders or other mental illness typically involves counseling with a mental health professional, medicine, or a combination of both. Your health care provider will help determine what treatment is best for you.  Panic attacks due to physical illness usually go away with treatment of the illness. If prescription medicine is causing panic attacks, talk with your health care provider about stopping the medicine, decreasing the dose, or substituting another  medicine.  Panic attacks due to alcohol or drug abuse go away with abstinence. Some adults need professional help in order to stop drinking or using drugs. Follow these instructions at home:  Take all medicines as directed by your health care provider.  Schedule and attend follow-up visits as directed by your health care provider. It is important to keep all your appointments. Contact a health care provider if:  You are not able to take your medicines as prescribed.  Your symptoms do not improve or get worse. Get help right away if:  You experience panic attack symptoms that are different than your usual symptoms.  You have serious thoughts about hurting yourself or others.  You are taking medicine for panic attacks and have a serious side effect.

## 2016-12-04 ENCOUNTER — Encounter (HOSPITAL_COMMUNITY): Payer: Self-pay

## 2016-12-04 DIAGNOSIS — D649 Anemia, unspecified: Secondary | ICD-10-CM | POA: Diagnosis not present

## 2016-12-04 DIAGNOSIS — N83201 Unspecified ovarian cyst, right side: Secondary | ICD-10-CM | POA: Diagnosis not present

## 2016-12-04 DIAGNOSIS — Z79899 Other long term (current) drug therapy: Secondary | ICD-10-CM | POA: Diagnosis not present

## 2016-12-04 DIAGNOSIS — J45909 Unspecified asthma, uncomplicated: Secondary | ICD-10-CM | POA: Diagnosis not present

## 2016-12-04 DIAGNOSIS — R1033 Periumbilical pain: Secondary | ICD-10-CM | POA: Diagnosis present

## 2016-12-04 LAB — COMPREHENSIVE METABOLIC PANEL
ALBUMIN: 3.8 g/dL (ref 3.5–5.0)
ALT: 24 U/L (ref 14–54)
AST: 33 U/L (ref 15–41)
Alkaline Phosphatase: 83 U/L (ref 38–126)
Anion gap: 10 (ref 5–15)
BILIRUBIN TOTAL: 0.8 mg/dL (ref 0.3–1.2)
BUN: 13 mg/dL (ref 6–20)
CO2: 22 mmol/L (ref 22–32)
Calcium: 9.6 mg/dL (ref 8.9–10.3)
Chloride: 102 mmol/L (ref 101–111)
Creatinine, Ser: 0.68 mg/dL (ref 0.44–1.00)
GFR calc Af Amer: >60 mL/min (ref >60)
GFR calc non Af Amer: >60 mL/min (ref >60)
GLUCOSE: 88 mg/dL (ref 65–99)
POTASSIUM: 4.9 mmol/L (ref 3.5–5.1)
SODIUM: 134 mmol/L — AB (ref 135–145)
Total Protein: 7.9 g/dL (ref 6.5–8.1)

## 2016-12-04 LAB — URINALYSIS, ROUTINE W REFLEX MICROSCOPIC
BILIRUBIN URINE: NEGATIVE
GLUCOSE, UA: NEGATIVE mg/dL
KETONES UR: NEGATIVE mg/dL
Nitrite: NEGATIVE
PROTEIN: NEGATIVE mg/dL
Specific Gravity, Urine: 1.026 (ref 1.005–1.030)
pH: 5 (ref 5.0–8.0)

## 2016-12-04 LAB — CBC
HEMATOCRIT: 36.6 % (ref 36.0–46.0)
Hemoglobin: 11.6 g/dL — ABNORMAL LOW (ref 12.0–15.0)
MCH: 22.6 pg — AB (ref 26.0–34.0)
MCHC: 31.7 g/dL (ref 30.0–36.0)
MCV: 71.3 fL — AB (ref 78.0–100.0)
Platelets: 328 10*3/uL (ref 150–400)
RBC: 5.13 MIL/uL — ABNORMAL HIGH (ref 3.87–5.11)
RDW: 16 % — AB (ref 11.5–15.5)
WBC: 16.7 10*3/uL — ABNORMAL HIGH (ref 4.0–10.5)

## 2016-12-04 LAB — LIPASE, BLOOD: Lipase: 18 U/L (ref 11–51)

## 2016-12-04 NOTE — ED Triage Notes (Signed)
Pt states that she has been having abd pain all day, vomited x 1 and now pain is in the RLQ. Pain is a stabbing feeling with cramps.

## 2016-12-05 ENCOUNTER — Emergency Department (HOSPITAL_COMMUNITY): Payer: BLUE CROSS/BLUE SHIELD

## 2016-12-05 ENCOUNTER — Encounter (HOSPITAL_COMMUNITY): Payer: Self-pay

## 2016-12-05 ENCOUNTER — Emergency Department (HOSPITAL_COMMUNITY)
Admission: EM | Admit: 2016-12-05 | Discharge: 2016-12-05 | Disposition: A | Discharge status: Home / Self Care | Payer: BLUE CROSS/BLUE SHIELD | Attending: Emergency Medicine | Admitting: Emergency Medicine

## 2016-12-05 DIAGNOSIS — N83201 Unspecified ovarian cyst, right side: Secondary | ICD-10-CM

## 2016-12-05 DIAGNOSIS — D509 Iron deficiency anemia, unspecified: Secondary | ICD-10-CM

## 2016-12-05 LAB — DIFFERENTIAL
Basophils Absolute: 0 10*3/uL (ref 0.0–0.1)
Basophils Relative: 0 %
EOS PCT: 1 %
Eosinophils Absolute: 0.2 10*3/uL (ref 0.0–0.7)
LYMPHS PCT: 27 %
Lymphs Abs: 4.6 10*3/uL — ABNORMAL HIGH (ref 0.7–4.0)
MONO ABS: 1 10*3/uL (ref 0.1–1.0)
Monocytes Relative: 6 %
NEUTROS ABS: 11 10*3/uL — AB (ref 1.7–7.7)
NEUTROS PCT: 66 %

## 2016-12-05 LAB — I-STAT BETA HCG BLOOD, ED (MC, WL, AP ONLY)

## 2016-12-05 MED ORDER — IOPAMIDOL (ISOVUE-300) INJECTION 61%: 100.0000 mL | Freq: Once | INTRAVENOUS | Status: DC | PRN

## 2016-12-05 MED ORDER — MORPHINE SULFATE (PF) 4 MG/ML IV SOLN
4.0000 mg | Freq: Once | INTRAVENOUS | Status: AC
  Administered 2016-12-05: 4 mg via INTRAVENOUS
  Filled 2016-12-05: qty 1

## 2016-12-05 MED ORDER — ONDANSETRON HCL 4 MG/2ML IJ SOLN
4.0000 mg | Freq: Once | INTRAMUSCULAR | Status: AC
Start: 2016-12-05 — End: 2016-12-05
  Administered 2016-12-05: 4 mg via INTRAVENOUS
  Filled 2016-12-05: qty 2

## 2016-12-05 MED ORDER — ONDANSETRON HCL 4 MG PO TABS: 4.0000 mg | ORAL_TABLET | Freq: Four times a day (QID) | ORAL | 0 refills | Status: DC | PRN

## 2016-12-05 MED ORDER — OXYCODONE-ACETAMINOPHEN 5-325 MG PO TABS: 1.0000 | ORAL_TABLET | ORAL | 0 refills | Status: DC | PRN

## 2016-12-05 NOTE — Discharge Instructions (Signed)
Take ibuprofen for less severe pain. It can still be helpful to take ibuprofen if you are taking the stronger pain medication.
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
## 2017-01-13 NOTE — Progress Notes (Deleted)
Complete Physical  Assessment and Plan: Obesity Obesity with co morbidities- long discussion about weight loss, diet, and exercise  Allergy, subsequent encounter  Screening for blood or protein in urine - Microalbumin / creatinine urine ratio   Screening cholesterol level - Lipid panel  Screening for diabetes mellitus - Hemoglobin A1c - Insulin, fasting   History of UTI - Urinalysis, Routine w reflex microscopic (not at [facility]) - Urine culture   Medication management - CBC with Differential/Platelet - BASIC METABOLIC PANEL WITH GFR - TSH - Hepatic function panel - Magnesium  Vitamin D deficiency - VITAMIN D 25 Hydroxy (Vit-D Deficiency, Fractures)  Anemia, unspecified anemia type - Iron and TIBC - Ferritin - Vitamin B12  . Discussed med's effects and SE's. Screening labs and tests as requested with regular follow-up as recommended. Over 40 minutes of exam, counseling, chart review and critical decision making was performed  HPI  This very nice 26 y.o.female presents for complete physical.  Patient has no major health issues.  Patient reports no complaints at this time.  She will be moving to [place] on the 14th, boyfriend lives there, father of isabella, together x 6 years.  Was on BCP, but is causing irregular menses, lasting very long and heavy menses, following with Dr. Senaida Ores.  X child in Oct 2012, girl, isabella.  She does workout.  Was seen at [facility] for UTI in Jan, no symptoms at this time.  BMI is There is no height or weight on file to calculate BMI., she is working on diet and exercise. Wt Readings from Last 3 Encounters:  09/22/16 238 lb 3.2 oz (108 kg)  12/09/15 238 lb 12.8 oz (108.3 kg)  06/23/11 196 lb (88.9 kg) (97 %, Z= 1.93)*   * Growth percentiles are based on CDC 2-20 Years data.     Current Medications:  Current Outpatient Prescriptions on File Prior to Visit  Medication Sig Dispense Refill  . ALPRAZolam (XANAX) 0.5 MG tablet Take 1/2  to 1 tablet every 4 hours if needed for Panic Attacks 30 tablet 0  . ondansetron (ZOFRAN) 4 MG tablet Take 1 tablet (4 mg total) by mouth every 6 (six) hours as needed for nausea or vomiting. 12 tablet 0  . oxyCODONE-acetaminophen (PERCOCET) 5-325 MG tablet Take 1 tablet by mouth every 4 (four) hours as needed for moderate pain. 10 tablet 0   No current facility-administered medications on file prior to visit.    Health Maintenance:   TD/TDAP: unknown- will check Influenza:  Pneumovax: N/A Prevnar 13: N/A HPV: never but willing to get  LMP: No LMP recorded. Sexually Active: yes STD testing offered Pap: 2016 at [facility], Dr. Roxan Hockey MGM:  N/A Last Dental Exam: Last Eye Exam:  Medical History:  Past Medical History:  Diagnosis Date  . Allergy   . Asthma   . Obesity    Allergies No Known Allergies  SURGICAL HISTORY She  has a past surgical history that includes abcess tooth (2010); wisdom teeth (2009); and Cholecystectomy (10/2011). FAMILY HISTORY Her family history is not on file. She was adopted. SOCIAL HISTORY She  reports that she has never smoked. She has never used smokeless tobacco. She reports that she drinks alcohol. She reports that she does not use drugs.   Review of Systems: Review of Systems  Constitutional: Negative.   HENT: Negative.   Eyes: Negative.   Respiratory: Negative.   Cardiovascular: Negative.   Gastrointestinal: Negative.   Genitourinary: Negative.   Musculoskeletal: Negative.   Skin:  Negative.   Neurological: Negative.   Endo/Heme/Allergies: Negative.   Psychiatric/Behavioral: Negative.     Physical Exam: Estimated body mass index is 46.52 kg/m as calculated from the following:   Height as of 09/22/16: 5' (1.524 m).   Weight as of 09/22/16: 238 lb 3.2 oz (108 kg). There were no vitals taken for this visit. General Appearance: Well nourished, in no apparent distress.  Eyes: PERRLA, EOMs, conjunctiva no swelling or erythema, normal fundi and  vessels.  Sinuses: No Frontal/maxillary tenderness  ENT/Mouth: Ext aud canals clear, normal light reflex with TMs without erythema, bulging. Good dentition. No erythema, swelling, or exudate on post pharynx. Tonsils not swollen or erythematous. Hearing normal.  Neck: Supple, thyroid normal. No bruits  Respiratory: Respiratory effort normal, BS equal bilaterally without rales, rhonchi, wheezing or stridor.  Cardio: RRR without murmurs, rubs or gallops. Brisk peripheral pulses without edema.  Chest: symmetric, with normal excursions and percussion.  Breasts: defer  Abdomen: Soft, nontender, obese, no guarding, rebound, hernias, masses, or organomegaly.  Lymphatics: Non tender without lymphadenopathy.  Genitourinary: defer Musculoskeletal: Full ROM all peripheral extremities,5/5 strength, and normal gait.  Skin: Warm, dry without rashes, lesions, ecchymosis. Neuro: Cranial nerves intact, reflexes equal bilaterally. Normal muscle tone, no cerebellar symptoms. Sensation intact.  Psych: Awake and oriented X 3, normal affect, Insight and Judgment appropriate.   EKG: defer  Quentin Mulling 7:29 AM [facility]

## 2017-01-14 ENCOUNTER — Encounter: Payer: Self-pay | Admitting: Physician Assistant

## 2017-06-10 NOTE — Progress Notes (Signed)
Assessment and Plan:  Teresa Summers was seen today for acute visit, diarrhea, emesis, bradycardia and tachycardia.  Diagnoses and all orders for this visit:  Gastroenteritis -     CBC with Differential/Platelet -     BASIC METABOLIC PANEL WITH GFR  Encouraged to continue to push fluids, add in gatorade, etc electrolyte supplemented sports drink to what she is currently doing. She can continue taking zofran for nausea/emesis, continue with bland diet for the next several days (information provided), try imodium instead of pepto-bismol for diarrhea. She appears much less anxious, smiling. Encouraged to contact us with any concerns, or to go to the ER should she start throwing up blood, see large amounts of blood in stool.    Further disposition pending results of labs. Discussed med's effects and SE's.   Over 30 minutes of exam, counseling, chart review, and critical decision making was performed.   Future Appointments Date Time Provider Department Center  10/14/2017 9:00 AM Quentin Mulling, PA-C [facility] None     HPI 25 y.o.female very pleasant somewhat anxious-appearing with history of seasonal allergies, asthma and obesity presents for follow up from the ER. Patient went to the ER on 06/07/17 at the [facility] ER for complaints of dizziness, shakiness, SOB and anxiety. Extensive workup by Dr. Lyda Perone, discharged with diagnoses of tachycardia, dehydration and gastroenteritis. She was prescribed zofran ODT PRN nausea.   She was seen at this office in January by Dr. Oneta Rack for a panic attack and prescribed xanax 0.5 mg, which she has with her today but has not taken.   She continues to have emesis ~2x daily, intermittent watery to soft stool x4 daily (reports she forms the soft stools after eating). She has been drinking water 16.9 oz x6, no sports drinks, and mainly eating saltines. She continues to experience mild tachycardia with ambulation, and c/o calf cramping. She has taken pepto-bismol for  diarrhea without significant improvement.   She appears anxious in office regarding her continued tachycardia, discussed we can recheck some basic labs today which may show some electrolyte imbalances, and likely continued dehydration. Encouraged her to alternate water with a electrolyte supplemented sports drink and increase the quantity while she continues to have diarrhea/vomiting. Discussed that she may continue to take zofran ODT for nausea/vomiting, she may try imodium for diarrhea for a few days. Discussed viral illness is likely to resolve within the next few days to a week,     Filed Weights   06/11/17 0915  Weight: 197 lb (89.4 kg)    Past Medical History:  Diagnosis Date  . Allergy   . Asthma   . Obesity      No Known Allergies    No current outpatient prescriptions on file prior to visit.   No current facility-administered medications on file prior to visit.     ROS: Review of Systems  Constitutional: Positive for malaise/fatigue and weight loss (Intentional 30 lb weight loss over last few months). Negative for chills, diaphoresis and fever.  HENT: Negative.   Eyes: Negative for blurred vision.  Respiratory: Negative for cough and shortness of breath.   Cardiovascular: Positive for palpitations. Negative for chest pain (Fast heart beats), orthopnea, claudication and leg swelling.  Gastrointestinal: Positive for diarrhea, heartburn, nausea and vomiting. Negative for abdominal pain, blood in stool, constipation and melena.  Genitourinary: Negative for dysuria, frequency and urgency.  Musculoskeletal: Negative for joint pain and myalgias.  Skin: Negative for rash.  Neurological: Negative for dizziness, tingling, sensory change, weakness and headaches.  Psychiatric/Behavioral: Negative for depression. The patient is nervous/anxious. The patient does not have insomnia.   All other systems reviewed and are negative.    Physical Exam: Filed Weights   06/11/17 0915   Weight: 197 lb (89.4 kg)   BP 128/76   Pulse (!) 107   Temp 97.7 F (36.5 C)   Resp 18   Ht 5' (1.524 m)   Wt 197 lb (89.4 kg)   LMP 05/20/2017   SpO2 97%   BMI 38.47 kg/m  General Appearance: Well nourished, in no apparent distress. Eyes: PERRLA, EOMs, conjunctiva no swelling or erythema Sinuses: No Frontal/maxillary tenderness ENT/Mouth: Ext aud canals clear, TMs without erythema, bulging. No erythema, swelling, or exudate on post pharynx.  Tonsils not swollen or erythematous. Hearing normal.  Neck: Supple, thyroid normal.  Respiratory: Respiratory effort normal, BS equal bilaterally without rales, rhonchi, wheezing or stridor.  Cardio: RRR with no MRGs. Brisk peripheral pulses without edema.  Abdomen: Soft, + BS.  Mildly tender to lower quadrants, no guarding, rebound, hernias, masses. Lymphatics: Non tender without lymphadenopathy.  Musculoskeletal: Full ROM, 5/5 strength, normal gait.  Skin: Warm, dry without rashes, lesions, ecchymosis.  Neuro: Cranial nerves intact. Normal muscle tone, no cerebellar symptoms. Sensation intact.  Psych: Awake and oriented X 3, normal affect, Insight and Judgment appropriate.     Dan Maker, NP 11:06 AM [facility]

## 2017-06-11 ENCOUNTER — Encounter: Payer: Self-pay | Admitting: Adult Health

## 2017-06-11 ENCOUNTER — Ambulatory Visit (INDEPENDENT_AMBULATORY_CARE_PROVIDER_SITE_OTHER): Payer: 59 | Admitting: Adult Health

## 2017-06-11 VITALS — BP 128/76 | HR 107 | Temp 97.7°F | Resp 18 | Ht 60.0 in | Wt 197.0 lb

## 2017-06-11 DIAGNOSIS — K529 Noninfective gastroenteritis and colitis, unspecified: Secondary | ICD-10-CM | POA: Diagnosis not present

## 2017-06-11 DIAGNOSIS — R000 Tachycardia, unspecified: Secondary | ICD-10-CM | POA: Diagnosis not present

## 2017-06-11 NOTE — Patient Instructions (Addendum)
Food Choices to Help Relieve Diarrhea, Adult When you have diarrhea, the foods you eat and your eating habits are very important. Choosing the right foods and drinks can help:  Relieve diarrhea.  Replace lost fluids and nutrients.  Prevent dehydration.  What general guidelines should I follow? Relieving diarrhea  Choose foods with less than 2 g or .07 oz. of fiber per serving.  Limit fats to less than 8 tsp (38 g or 1.34 oz.) a day.  Avoid the following: ? Foods and beverages sweetened with high-fructose corn syrup, honey, or sugar alcohols such as xylitol, sorbitol, and mannitol. ? Foods that contain a lot of fat or sugar. ? Fried, greasy, or spicy foods. ? High-fiber grains, breads, and cereals. ? Raw fruits and vegetables.  Eat foods that are rich in probiotics. These foods include dairy products such as yogurt and fermented milk products. They help increase healthy bacteria in the stomach and intestines (gastrointestinal tract, or GI tract).  If you have lactose intolerance, avoid dairy products. These may make your diarrhea worse.  Take medicine to help stop diarrhea (antidiarrheal medicine) only as told by your health care provider. Replacing nutrients  Eat small meals or snacks every 3-4 hours.  Eat bland foods, such as white rice, toast, or baked potato, until your diarrhea starts to get better. Gradually reintroduce nutrient-rich foods as tolerated or as told by your health care provider. This includes: ? Well-cooked protein foods. ? Peeled, seeded, and soft-cooked fruits and vegetables. ? Low-fat dairy products.  Take vitamin and mineral supplements as told by your health care provider. Preventing dehydration   Start by sipping water or a special solution to prevent dehydration (oral rehydration solution, ORS). Urine that is clear or pale yellow means that you are getting enough fluid.  Try to drink at least 8-10 cups of fluid each day to help replace lost  fluids.  You may add other liquids in addition to water, such as clear juice or decaffeinated sports drinks, as tolerated or as told by your health care provider.  Avoid drinks with caffeine, such as coffee, tea, or soft drinks.  Avoid alcohol. What foods are recommended? The items listed may not be a complete list. Talk with your health care provider about what dietary choices are best for you. Grains White rice. White, French, or pita breads (fresh or toasted), including plain rolls, buns, or bagels. White pasta. Saltine, soda, or graham crackers. Pretzels. Low-fiber cereal. Cooked cereals made with water (such as cornmeal, farina, or cream cereals). Plain muffins. Matzo. Melba toast. Zwieback. Vegetables Potatoes (without the skin). Most well-cooked and canned vegetables without skins or seeds. Tender lettuce. Fruits Apple sauce. Fruits canned in juice. Cooked apricots, cherries, grapefruit, peaches, pears, or plums. Fresh bananas and cantaloupe. Meats and other protein foods Baked or boiled chicken. Eggs. Tofu. Fish. Seafood. Smooth nut butters. Ground or well-cooked tender beef, ham, veal, lamb, pork, or poultry. Dairy Plain yogurt, kefir, and unsweetened liquid yogurt. Lactose-free milk, buttermilk, skim milk, or soy milk. Low-fat or nonfat hard cheese. Beverages Water. Low-calorie sports drinks. Fruit juices without pulp. Strained tomato and vegetable juices. Decaffeinated teas. Sugar-free beverages not sweetened with sugar alcohols. Oral rehydration solutions, if approved by your health care provider. Seasoning and other foods Bouillon, broth, or soups made from recommended foods. What foods are not recommended? The items listed may not be a complete list. Talk with your health care provider about what dietary choices are best for you. Grains Whole grain, whole wheat,   bran, or rye breads, rolls, pastas, and crackers. Wild or brown rice. Whole grain or bran cereals. Barley. Oats and  oatmeal. Corn tortillas or taco shells. Granola. Popcorn. Vegetables Raw vegetables. Fried vegetables. Cabbage, broccoli, Brussels sprouts, artichokes, baked beans, beet greens, corn, kale, legumes, peas, sweet potatoes, and yams. Potato skins. Cooked spinach and cabbage. Fruits Dried fruit, including raisins and dates. Raw fruits. Stewed or dried prunes. Canned fruits with syrup. Meat and other protein foods Fried or fatty meats. Deli meats. Chunky nut butters. Nuts and seeds. Beans and lentils. Bacon. Hot dogs. Sausage. Dairy High-fat cheeses. Whole milk, chocolate milk, and beverages made with milk, such as milk shakes. Half-and-half. Cream. sour cream. Ice cream. Beverages Caffeinated beverages (such as coffee, tea, soda, or energy drinks). Alcoholic beverages. Fruit juices with pulp. Prune juice. Soft drinks sweetened with high-fructose corn syrup or sugar alcohols. High-calorie sports drinks. Fats and oils Butter. Cream sauces. Margarine. Salad oils. Plain salad dressings. Olives. Avocados. Mayonnaise. Sweets and desserts Sweet rolls, doughnuts, and sweet breads. Sugar-free desserts sweetened with sugar alcohols such as xylitol and sorbitol. Seasoning and other foods Honey. Hot sauce. Chili powder. Gravy. Cream-based or milk-based soups. Pancakes and waffles. Summary  When you have diarrhea, the foods you eat and your eating habits are very important.  Make sure you get at least 8-10 cups of fluid each day, or enough to keep your urine clear or pale yellow.  Eat bland foods and gradually reintroduce healthy, nutrient-rich foods as tolerated, or as told by your health care provider.  Avoid high-fiber, fried, greasy, or spicy foods. This information is not intended to replace advice given to you by your health care provider. Make sure you discuss any questions you have with your health care provider. Document Released: 11/28/2003 Document Revised: 09/04/2016 Document Reviewed:  09/04/2016 Elsevier Interactive Patient Education  2017 [employer]vier Inc.  

## 2017-06-14 ENCOUNTER — Other Ambulatory Visit: Payer: Self-pay | Admitting: Adult Health

## 2017-06-14 DIAGNOSIS — D509 Iron deficiency anemia, unspecified: Secondary | ICD-10-CM

## 2017-06-14 LAB — IRON, TOTAL/TOTAL IRON BINDING CAP
%SAT: 3 % — AB (ref 11–50)
Iron: 15 ug/dL — ABNORMAL LOW (ref 40–190)
TIBC: 475 mcg/dL (calc) — ABNORMAL HIGH (ref 250–450)

## 2017-06-14 LAB — TEST AUTHORIZATION 2

## 2017-06-14 LAB — VITAMIN B12: Vitamin B-12: 356 pg/mL (ref 200–1100)

## 2017-06-14 LAB — CBC WITH DIFFERENTIAL/PLATELET
BASOS PCT: 0.2 %
Basophils Absolute: 21 cells/uL (ref 0–200)
EOS ABS: 64 {cells}/uL (ref 15–500)
EOS PCT: 0.6 %
HCT: 33.8 % — ABNORMAL LOW (ref 35.0–45.0)
Hemoglobin: 10.5 g/dL — ABNORMAL LOW (ref 11.7–15.5)
LYMPHS ABS: 1982 {cells}/uL (ref 850–3900)
MCH: 21.9 pg — ABNORMAL LOW (ref 27.0–33.0)
MCHC: 31.1 g/dL — ABNORMAL LOW (ref 32.0–36.0)
MCV: 70.4 fL — AB (ref 80.0–100.0)
MPV: 8.8 fL (ref 7.5–12.5)
Monocytes Relative: 4.8 %
NEUTROS ABS: 8024 {cells}/uL — AB (ref 1500–7800)
Neutrophils Relative %: 75.7 %
PLATELETS: 460 10*3/uL — AB (ref 140–400)
RBC: 4.8 10*6/uL (ref 3.80–5.10)
RDW: 17.3 % — ABNORMAL HIGH (ref 11.0–15.0)
Total Lymphocyte: 18.7 %
WBC: 10.6 10*3/uL (ref 3.8–10.8)
WBCMIX: 509 {cells}/uL (ref 200–950)

## 2017-06-14 LAB — FERRITIN: FERRITIN: 2 ng/mL — AB (ref 10–154)

## 2017-06-14 LAB — BASIC METABOLIC PANEL WITH GFR
BUN: 7 mg/dL (ref 7–25)
CALCIUM: 9.7 mg/dL (ref 8.6–10.2)
CHLORIDE: 103 mmol/L (ref 98–110)
CO2: 22 mmol/L (ref 20–32)
Creat: 0.8 mg/dL (ref 0.50–1.10)
GFR, Est African American: 120 mL/min/{1.73_m2} (ref >=60)
GFR, Est Non African American: 104 mL/min/{1.73_m2} (ref >=60)
GLUCOSE: 87 mg/dL (ref 65–99)
POTASSIUM: 4.3 mmol/L (ref 3.5–5.3)
Sodium: 137 mmol/L (ref 135–146)

## 2017-06-14 LAB — TEST AUTHORIZATION

## 2017-06-14 MED ORDER — FERROUS SULFATE 325 (65 FE) MG PO TABS: 325.0000 mg | ORAL_TABLET | Freq: Three times a day (TID) | ORAL | 0 refills | Status: DC

## 2017-06-14 NOTE — Progress Notes (Signed)
Pt aware of lab results & voiced understanding of those results.

## 2017-06-15 NOTE — Progress Notes (Signed)
LVM for pt to return office call for LAB results.

## 2017-06-16 NOTE — Progress Notes (Signed)
Pt aware of lab results & voiced understanding of those results.

## 2017-06-17 ENCOUNTER — Telehealth: Payer: Self-pay | Admitting: Internal Medicine

## 2017-06-17 NOTE — Telephone Encounter (Signed)
Patient called with c/o  heart rate increase upto 180 with excertion. Per Morrie Sheldon, referral to cardiology.

## 2017-06-18 ENCOUNTER — Encounter: Payer: Self-pay | Admitting: Internal Medicine

## 2017-06-18 NOTE — Addendum Note (Signed)
Addended by: Dan Maker on: 06/18/2017 08:28 AM   Modules accepted: Orders

## 2017-10-13 DIAGNOSIS — R7303 Prediabetes: Secondary | ICD-10-CM | POA: Insufficient documentation

## 2017-10-13 NOTE — Progress Notes (Deleted)
Complete Physical  Assessment and Plan:  Diagnoses and all orders for this visit:  Routine general medical examination at a health care facility  Morbid obesity [facility]) Long discussion about weight loss, diet, and exercise Recommended diet heavy in fruits and veggies and low in animal meats, cheeses, and dairy products, appropriate calorie intake Patient will work on *** Discussed appropriate weight for height (below***) and initial goal (***) Follow up at next visit  Allergic state, sequela  Panic attack  Iron deficiency anemia, unspecified iron deficiency anemia type -     CBC with Differential/Platelet  Medication management -     CBC with Differential/Platelet -     BASIC METABOLIC PANEL WITH GFR -     Hepatic function panel  Screening cholesterol level -     Lipid panel  Screening for cardiovascular condition -     EKG 12-Lead  Screening for hematuria or proteinuria -     Urinalysis w microscopic + reflex cultur  Screening for thyroid disorder -     TSH  Vitamin D deficiency -     VITAMIN D 25 Hydroxy (Vit-D Deficiency, Fractures)  Prediabetes Discussed disease and risks Discussed diet/exercise, weight management  -     Hemoglobin A1c  Discussed med's effects and SE's. Screening labs and tests as requested with regular follow-up as recommended. Over 40 minutes of exam, counseling, chart review, and complex, high level critical decision making was performed this visit.   Future Appointments  Date Time Provider Department Center  10/14/2017  9:00 AM Judd Gaudier, NP [facility] None  10/17/2018  9:00 AM Judd Gaudier, NP [facility] None     HPI  27 y.o. female  presents for a complete physical and follow up for has Morbid obesity (HCC); Allergy; Panic attack; Iron deficiency anemia; and Prediabetes on their problem list..  BMI is There is no height or weight on file to calculate BMI., she {HAS HAS ZOX:09604} been working on diet and exercise. Wt  Readings from Last 3 Encounters:  06/11/17 197 lb (89.4 kg)  09/22/16 238 lb 3.2 oz (108 kg)  12/09/15 238 lb 12.8 oz (108.3 kg)   Her blood pressure {HAS HAS NOT:18834} been controlled at home, today their BP is   She {DOES_DOES VWU:98119} workout. She denies chest pain, shortness of breath, dizziness.   She is not on cholesterol medication and denies myalgias. Her cholesterol is at goal. The cholesterol last visit was:   Lab Results  Component Value Date   CHOL 197 12/09/2015   HDL 55 12/09/2015   LDLCALC 123 12/09/2015   TRIG 97 12/09/2015   CHOLHDL 3.6 12/09/2015   She {Has/has not:18111} been working on diet and exercise for prediabetes, she is not on bASA, she is not on ACE/ARB and denies {Symptoms; diabetes w/o none:19199}. Last A1C in the office was:  Lab Results  Component Value Date   HGBA1C 6.0 (H) 12/09/2015   Last GFR: Lab Results  Component Value Date   [facility] 104 06/11/2017   Patient is not on Vitamin D supplement and very low at last check:    Lab Results  Component Value Date   VD25OH 12 (L) 12/09/2015      Current Medications:  Current Outpatient Medications on File Prior to Visit  Medication Sig Dispense Refill  . ferrous sulfate 325 (65 FE) MG tablet Take 1 tablet (325 mg total) by mouth 3 (three) times daily with meals. 90 tablet 0  . metFORMIN (GLUCOPHAGE) 500 MG tablet Take by  mouth 2 (two) times daily with a meal.    . norethindrone-ethinyl estradiol (JUNEL FE,GILDESS FE,LOESTRIN FE) 1-20 MG-MCG tablet Take 1 tablet by mouth daily.    . ondansetron (ZOFRAN-ODT) 4 MG disintegrating tablet Take 4 mg by mouth every 8 (eight) hours as needed for nausea or vomiting.     No current facility-administered medications on file prior to visit.    Allergies:  No Known Allergies Medical History:  She has Morbid obesity (HCC); Allergy; Panic attack; Iron deficiency anemia; and Prediabetes on their problem list. Health Maintenance:   Immunization History   Administered Date(s) Administered  . Td 07/11/2010    Tetanus: 2011 Flu vaccine: HPV:   LMP: No LMP recorded. Pap: MGM: n/a  Last Dental Exam: Last Eye Exam:  Patient Care Team: Lucky CowboyMcKeown, William, MD as PCP - General (Internal Medicine)  Surgical History:  She has a past surgical history that includes abcess tooth (2010); wisdom teeth (2009); and Cholecystectomy (10/2011). Family History:  Herfamily history is not on file. She was adopted. Social History:  She reports that  has never smoked. she has never used smokeless tobacco. She reports that she drinks alcohol. She reports that she does not use drugs.  Review of Systems: ROS  Physical Exam: Estimated body mass index is 38.47 kg/m as calculated from the following:   Height as of 06/11/17: 5' (1.524 m).   Weight as of 06/11/17: 197 lb (89.4 kg). There were no vitals taken for this visit. General Appearance: Well nourished, in no apparent distress.  Eyes: PERRLA, EOMs, conjunctiva no swelling or erythema, normal fundi and vessels.  Sinuses: No Frontal/maxillary tenderness  ENT/Mouth: Ext aud canals clear, normal light reflex with TMs without erythema, bulging. Good dentition. No erythema, swelling, or exudate on post pharynx. Tonsils not swollen or erythematous. Hearing normal.  Neck: Supple, thyroid normal. No bruits  Respiratory: Respiratory effort normal, BS equal bilaterally without rales, rhonchi, wheezing or stridor.  Cardio: RRR without murmurs, rubs or gallops. Brisk peripheral pulses without edema.  Chest: symmetric, with normal excursions and percussion.  Breasts: Symmetric, without lumps, nipple discharge, retractions.  Abdomen: Soft, nontender, no guarding, rebound, hernias, masses, or organomegaly.  Lymphatics: Non tender without lymphadenopathy.  Genitourinary:  Musculoskeletal: Full ROM all peripheral extremities,5/5 strength, and normal gait.  Skin: Warm, dry without rashes, lesions, ecchymosis. Neuro:  Cranial nerves intact, reflexes equal bilaterally. Normal muscle tone, no cerebellar symptoms. Sensation intact.  Psych: Awake and oriented X 3, normal affect, Insight and Judgment appropriate.   EKG: WNL no ST changes.  Dan MakerAshley C Paulita Licklider 9:34 AM [facility]

## 2017-10-14 ENCOUNTER — Encounter: Payer: Self-pay | Admitting: Adult Health

## 2017-11-15 NOTE — Progress Notes (Deleted)
Complete Physical  Assessment and Plan:  Diagnoses and all orders for this visit:  Encounter for routine adult physical exam with abnormal findings  Allergic state, sequela Well managed by OTC agents PRN  Iron deficiency anemia, unspecified iron deficiency anemia type -     CBC with Differential/Platelet -     Iron,Total/Total Iron Binding Cap  Morbid obesity (HCC) Long discussion about weight loss, diet, and exercise Recommended diet heavy in fruits and veggies and low in animal meats, cheeses, and dairy products, appropriate calorie intake Patient will work on *** Discussed appropriate weight for height (below***) and initial goal (***) Follow up at next visit  Panic attack None recently; symptoms stable   Prediabetes Continue metformin therapy Discussed disease and risks Discussed diet/exercise, weight management  -     Hemoglobin A1c  Screening cholesterol level -     Lipid panel   Vitamin D deficiency Recommend supplementation to reach goal of 70-100 Check vitamin D level -     VITAMIN D 25 Hydroxy (Vit-D Deficiency, Fractures)  Screening for thyroid disorder -     TSH  Medication management -     CBC with Differential/Platelet -     BASIC METABOLIC PANEL WITH GFR -     Hepatic function panel -     Urinalysis w microscopic + reflex cultur  Screening for cardiovascular condition -     EKG 12-Lead   Discussed med's effects and SE's. Screening labs and tests as requested with regular follow-up as recommended. Over 40 minutes of exam, counseling, chart review, and complex, high level critical decision making was performed this visit.   Future Appointments  Date Time Provider Department Center  11/16/2017 10:00 AM Judd Gaudier, NP [facility] None     HPI  27 y.o. female  presents for a complete physical and follow up for has Morbid obesity (HCC); Allergy; Panic attack; Iron deficiency anemia; and Prediabetes on their problem list..  BMI is There is  no height or weight on file to calculate BMI., she {HAS HAS ZOX:09604} been working on diet and exercise. Wt Readings from Last 3 Encounters:  06/11/17 197 lb (89.4 kg)  09/22/16 238 lb 3.2 oz (108 kg)  12/09/15 238 lb 12.8 oz (108.3 kg)   Her blood pressure {HAS HAS NOT:18834} been controlled at home, today their BP is   She {DOES_DOES VWU:98119} workout. She denies chest pain, shortness of breath, dizziness.   She is not on cholesterol medication and denies myalgias. Her cholesterol is at goal. The cholesterol last visit was:   Lab Results  Component Value Date   CHOL 197 12/09/2015   HDL 55 12/09/2015   LDLCALC 123 12/09/2015   TRIG 97 12/09/2015   CHOLHDL 3.6 12/09/2015   She {Has/has not:18111} been working on diet and exercise for prediabetes, she is not on bASA, she is not on ACE/ARB and denies {Symptoms; diabetes w/o none:19199}. Last A1C in the office was:  Lab Results  Component Value Date   HGBA1C 6.0 (H) 12/09/2015   Last GFR: Lab Results  Component Value Date   [facility] 104 06/11/2017   Patient is not on Vitamin D supplement and well below goal:    Lab Results  Component Value Date   VD25OH 12 (L) 12/09/2015      Current Medications:  Current Outpatient Medications on File Prior to Visit  Medication Sig Dispense Refill  . ferrous sulfate 325 (65 FE) MG tablet Take 1 tablet (325 mg total) by mouth 3 (  three) times daily with meals. 90 tablet 0  . metFORMIN (GLUCOPHAGE) 500 MG tablet Take by mouth 2 (two) times daily with a meal.    . norethindrone-ethinyl estradiol (JUNEL FE,GILDESS FE,LOESTRIN FE) 1-20 MG-MCG tablet Take 1 tablet by mouth daily.    . ondansetron (ZOFRAN-ODT) 4 MG disintegrating tablet Take 4 mg by mouth every 8 (eight) hours as needed for nausea or vomiting.     No current facility-administered medications on file prior to visit.    Allergies:  No Known Allergies Medical History:  She has Morbid obesity (HCC); Allergy; Panic attack; Iron  deficiency anemia; and Prediabetes on their problem list. Health Maintenance:   Immunization History  Administered Date(s) Administered  . Td 07/11/2010    Tetanus: 2011 Flu vaccine: HPV:   LMP: Pap: MGM:   Last Dental Exam: Last Eye Exam:  Patient Care Team: Lucky CowboyMcKeown, William, MD as PCP - General (Internal Medicine)  Surgical History:  She has a past surgical history that includes abcess tooth (2010); wisdom teeth (2009); and Cholecystectomy (10/2011). Family History:  Herfamily history is not on file. She was adopted. Social History:  She reports that  has never smoked. she has never used smokeless tobacco. She reports that she drinks alcohol. She reports that she does not use drugs.  Review of Systems: ROS  Physical Exam: Estimated body mass index is 38.47 kg/m as calculated from the following:   Height as of 06/11/17: 5' (1.524 m).   Weight as of 06/11/17: 197 lb (89.4 kg). There were no vitals taken for this visit. General Appearance: Well nourished, in no apparent distress.  Eyes: PERRLA, EOMs, conjunctiva no swelling or erythema, normal fundi and vessels.  Sinuses: No Frontal/maxillary tenderness  ENT/Mouth: Ext aud canals clear, normal light reflex with TMs without erythema, bulging. Good dentition. No erythema, swelling, or exudate on post pharynx. Tonsils not swollen or erythematous. Hearing normal.  Neck: Supple, thyroid normal. No bruits  Respiratory: Respiratory effort normal, BS equal bilaterally without rales, rhonchi, wheezing or stridor.  Cardio: RRR without murmurs, rubs or gallops. Brisk peripheral pulses without edema.  Chest: symmetric, with normal excursions and percussion.  Breasts: Symmetric, without lumps, nipple discharge, retractions.  Abdomen: Soft, nontender, no guarding, rebound, hernias, masses, or organomegaly.  Lymphatics: Non tender without lymphadenopathy.  Genitourinary:  Musculoskeletal: Full ROM all peripheral extremities,5/5  strength, and normal gait.  Skin: Warm, dry without rashes, lesions, ecchymosis. Neuro: Cranial nerves intact, reflexes equal bilaterally. Normal muscle tone, no cerebellar symptoms. Sensation intact.  Psych: Awake and oriented X 3, normal affect, Insight and Judgment appropriate.   EKG: WNL no ST changes.  Carlyon ShadowAshley C Trixie Summers 8:16 AM [facility]

## 2017-11-16 ENCOUNTER — Encounter: Payer: Self-pay | Admitting: Adult Health

## 2018-02-07 ENCOUNTER — Encounter: Payer: Self-pay | Admitting: Physician Assistant

## 2018-10-17 ENCOUNTER — Encounter: Payer: Self-pay | Admitting: Adult Health

## 2018-11-16 ENCOUNTER — Encounter: Payer: Self-pay | Admitting: Adult Health

## 2019-03-16 IMAGING — CT CT ABD-PELV W/ CM
2 of 4 series · 17 of 46 positions shown, 19 images · IV contrast (agent unspecified)
Comparison: None.

CLINICAL DATA: Sharp right lower quadrant pain and vomiting.

EXAM:
CT ABDOMEN AND PELVIS WITH CONTRAST
TECHNIQUE: Multidetector CT imaging of the abdomen and pelvis was performed
using the standard protocol following bolus administration of
intravenous contrast.
CONTRAST:  100 mL [id number]

[Series 3: a/p w/ 5mm · axial · 0.82mm/px · z∈[+683,+1103]mm · 14 of 92 slices shown, 16 images]
[im 4/92  soft-tissue]
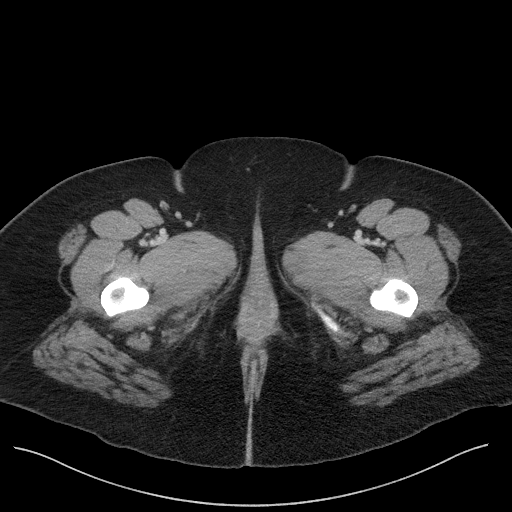
[im 4/92  bone]
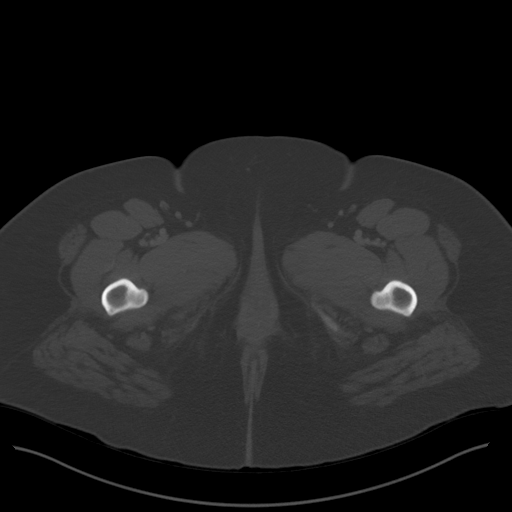
[im 12/92  soft-tissue]
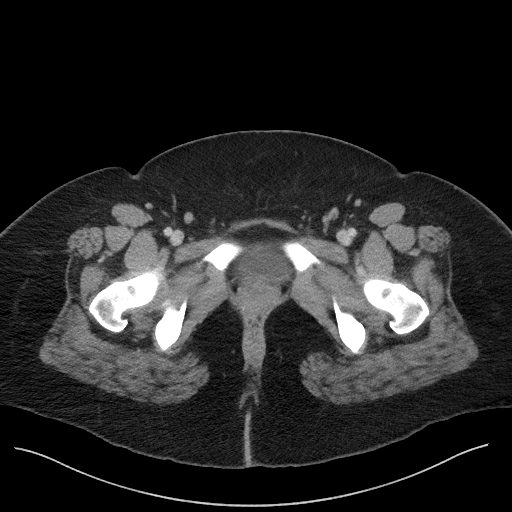
[im 19/92  soft-tissue]
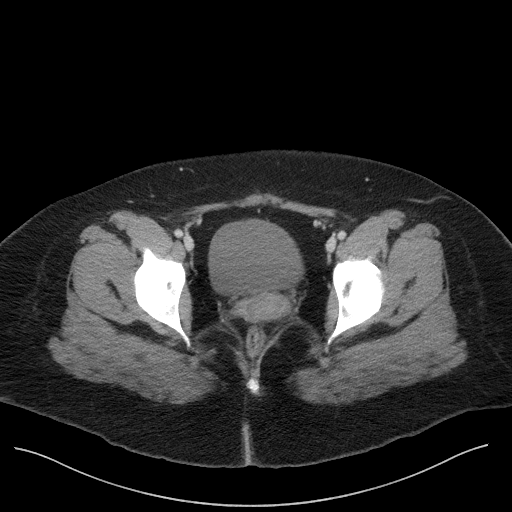
[im 23/92  soft-tissue]
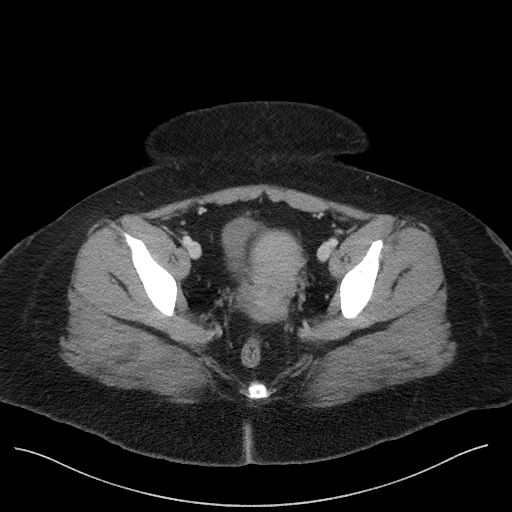
[im 31/92  soft-tissue]
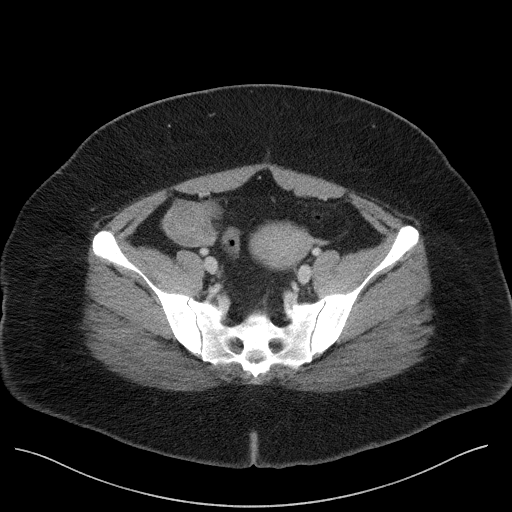
[im 38/92  soft-tissue]
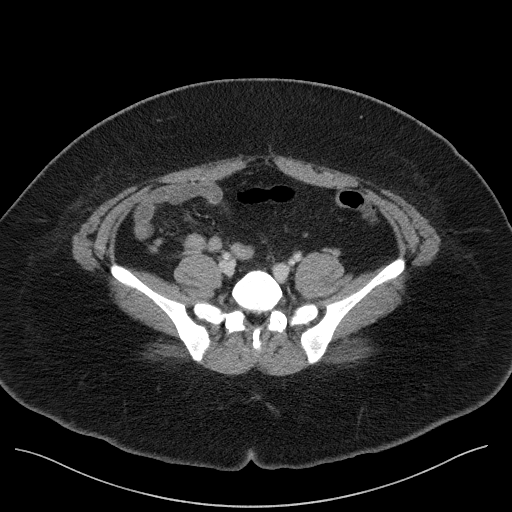
[im 42/92  soft-tissue]
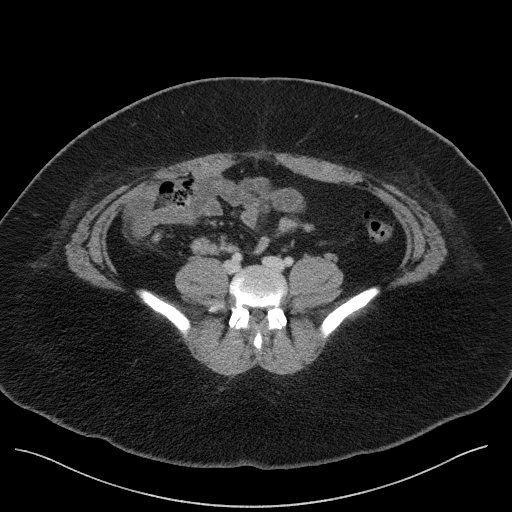
[im 50/92  soft-tissue]
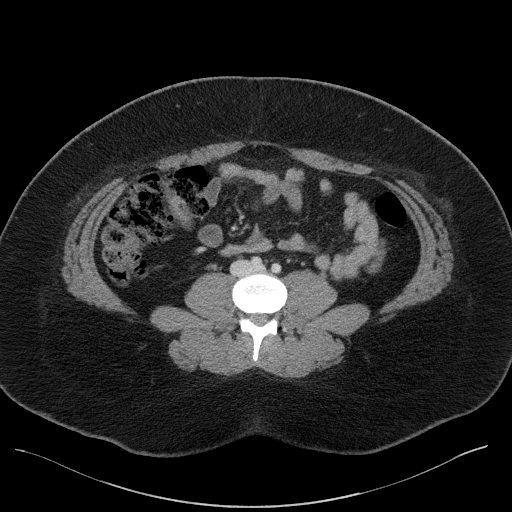
[im 54/92  soft-tissue]
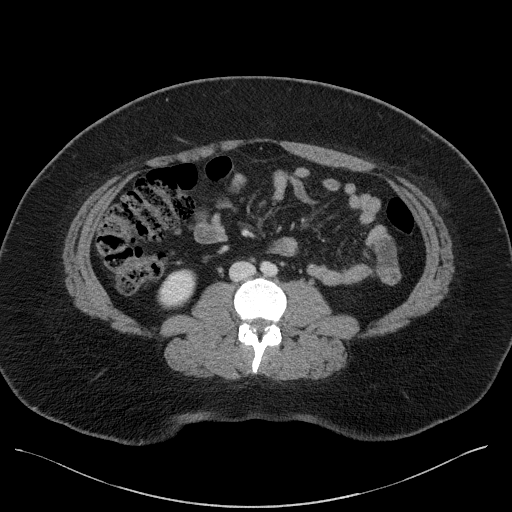
[im 54/92  bone]
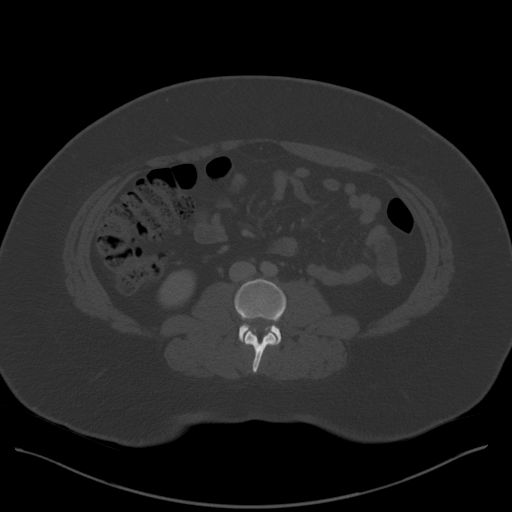
[im 61/92  soft-tissue]
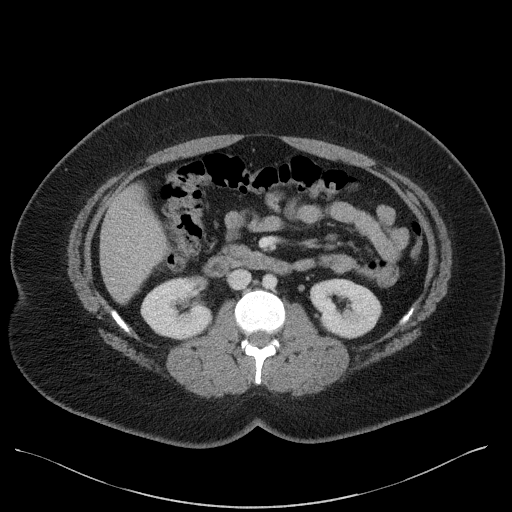
[im 69/92  soft-tissue]
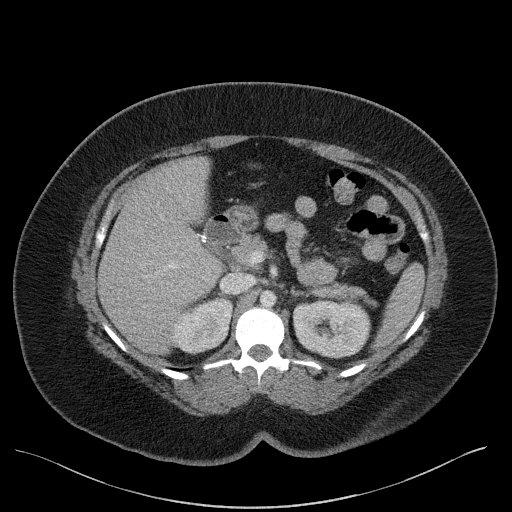
[im 73/92  soft-tissue]
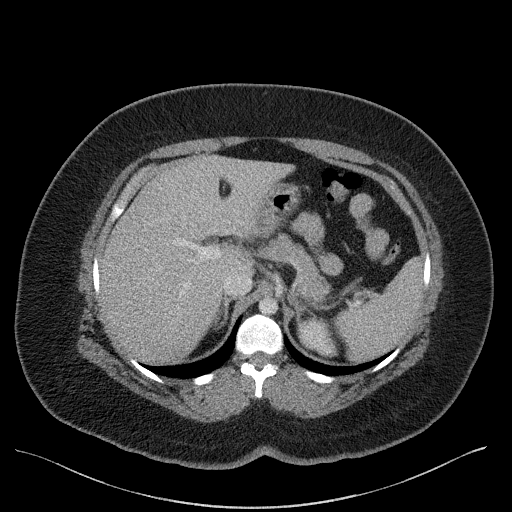
[im 80/92  soft-tissue]
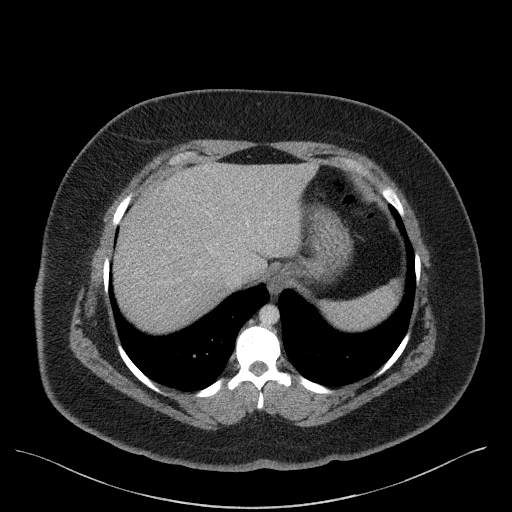
[im 88/92  soft-tissue]
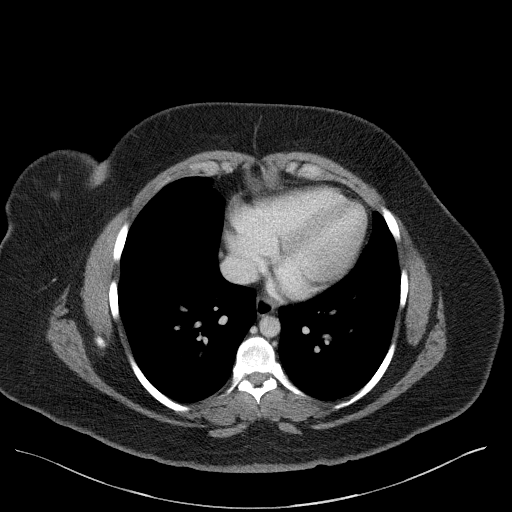

[Series 6: a/p w/ cor · coronal · 0.88mm/px · 3 of 130 slices shown]
[im 44/130  soft-tissue]
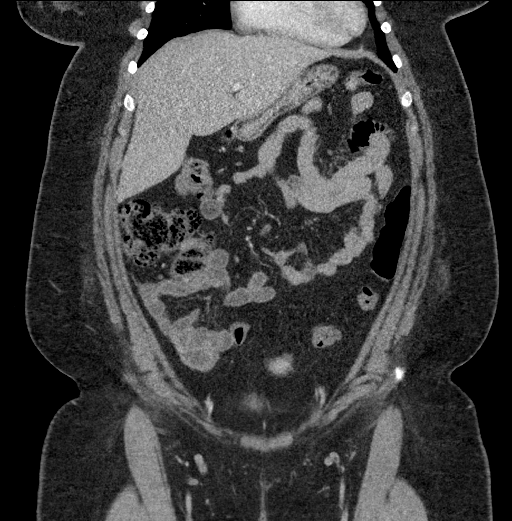
[im 58/130  soft-tissue]
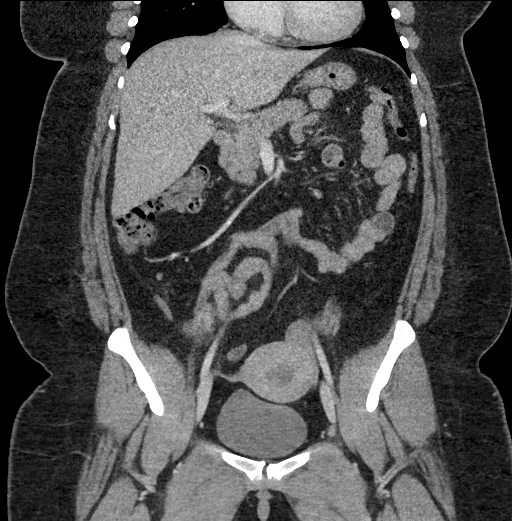
[im 72/130  soft-tissue]
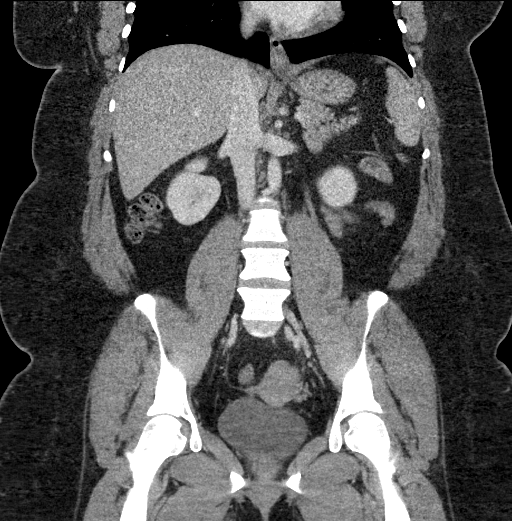

[17 of 46 positions shown; findings below may reference images not displayed]

FINDINGS: Lower chest: No pulmonary nodules. No visible pleural or pericardial
effusion.

Hepatobiliary: Normal hepatic size and contours without focal liver
lesion. No perihepatic ascites. No intra- or extrahepatic biliary
dilatation. Gallbladder surgically absent.

Pancreas: Normal pancreatic contours and enhancement. No
peripancreatic fluid collection or pancreatic ductal dilatation.

Spleen: Normal.

Adrenals/Urinary Tract: Normal adrenal glands. No hydronephrosis or
solid renal mass.

Stomach/Bowel: No abnormal bowel dilatation. No bowel wall
thickening or adjacent fat stranding to indicate acute inflammation.
No abdominal fluid collection. Normal appendix.

Vascular/Lymphatic: Normal course and caliber of the major abdominal
vessels. No abdominal or pelvic lymphadenopathy.

Reproductive: There is fluid within the endometrial cavity. There is
a peripherally enhancing low-density focus within the right ovary.
Left ovary is normal. No free fluid in the pelvis.

Musculoskeletal: No lytic or blastic osseous lesion. Normal
visualized extrathoracic and extraperitoneal soft tissues.

Other: No contributory non-categorized findings.
IMPRESSION: 1. Normal appendix.
2. Peripherally enhancing cystic focus of the right ovary, possibly
corpus luteum cyst or hemorrhagic cyst. No free fluid in the pelvis.
Pelvic ultrasound could be considered for further evaluation, if
clinically indicated.

## 2019-08-24 NOTE — Progress Notes (Deleted)
Assessment and Plan:  There are no diagnoses linked to this encounter.    Further disposition pending results of labs. Discussed med's effects and SE's.   Over 30 minutes of exam, counseling, chart review, and critical decision making was performed.   Future Appointments  Date Time Provider [place]  08/25/2019 11:00 AM Liane Comber, NP [facility] None    ------------------------------------------------------------------------------------------------------------------   HPI There were no vitals taken for this visit.  28 y.o.female presents for  Last seen 05/2017  Hx of anxiety, panic attacks, episodes of tachycardia *** was seen by [facility] cardiology   Past Medical History:  Diagnosis Date  . Allergy   . Asthma   . Obesity      No Known Allergies  Current Outpatient Medications on File Prior to Visit  Medication Sig  . ferrous sulfate 325 (65 FE) MG tablet Take 1 tablet (325 mg total) by mouth 3 (three) times daily with meals.  . metFORMIN (GLUCOPHAGE) 500 MG tablet Take by mouth 2 (two) times daily with a meal.  . norethindrone-ethinyl estradiol (JUNEL FE,GILDESS FE,LOESTRIN FE) 1-20 MG-MCG tablet Take 1 tablet by mouth daily.  . ondansetron (ZOFRAN-ODT) 4 MG disintegrating tablet Take 4 mg by mouth every 8 (eight) hours as needed for nausea or vomiting.   No current facility-administered medications on file prior to visit.     ROS: all negative except above.   Physical Exam:  There were no vitals taken for this visit.  General Appearance: Well nourished, in no apparent distress. Eyes: PERRLA, EOMs, conjunctiva no swelling or erythema Sinuses: No Frontal/maxillary tenderness ENT/Mouth: Ext aud canals clear, TMs without erythema, bulging. No erythema, swelling, or exudate on post pharynx.  Tonsils not swollen or erythematous. Hearing normal.  Neck: Supple, thyroid normal.  Respiratory: Respiratory effort normal, BS equal bilaterally without rales,  rhonchi, wheezing or stridor.  Cardio: RRR with no MRGs. Brisk peripheral pulses without edema.  Abdomen: Soft, + BS.  Non tender, no guarding, rebound, hernias, masses. Lymphatics: Non tender without lymphadenopathy.  Musculoskeletal: Full ROM, 5/5 strength, normal gait.  Skin: Warm, dry without rashes, lesions, ecchymosis.  Neuro: Cranial nerves intact. Normal muscle tone, no cerebellar symptoms. Sensation intact.  Psych: Awake and oriented X 3, normal affect, Insight and Judgment appropriate.     Teresa Ribas, NP 2:01 PM [facility]

## 2019-08-25 ENCOUNTER — Ambulatory Visit: Payer: 59 | Admitting: Adult Health

## 2019-09-06 NOTE — Progress Notes (Deleted)
Assessment and Plan:  There are no diagnoses linked to this encounter.    Further disposition pending results of labs. Discussed med's effects and SE's.   Over 30 minutes of exam, counseling, chart review, and critical decision making was performed.   Future Appointments  Date Time Provider [place]  09/08/2019 10:00 AM Liane Comber, NP [facility] None    ------------------------------------------------------------------------------------------------------------------   HPI There were no vitals taken for this visit.  28 y.o.female presents for  Last seen 05/2017  Hx of anxiety, panic attacks, episodes of tachycardia *** was seen by [facility] cardiology   Past Medical History:  Diagnosis Date  . Allergy   . Asthma   . Obesity      No Known Allergies  Current Outpatient Medications on File Prior to Visit  Medication Sig  . ferrous sulfate 325 (65 FE) MG tablet Take 1 tablet (325 mg total) by mouth 3 (three) times daily with meals.  . metFORMIN (GLUCOPHAGE) 500 MG tablet Take by mouth 2 (two) times daily with a meal.  . norethindrone-ethinyl estradiol (JUNEL FE,GILDESS FE,LOESTRIN FE) 1-20 MG-MCG tablet Take 1 tablet by mouth daily.  . ondansetron (ZOFRAN-ODT) 4 MG disintegrating tablet Take 4 mg by mouth every 8 (eight) hours as needed for nausea or vomiting.   No current facility-administered medications on file prior to visit.    ROS: all negative except above.   Physical Exam:  There were no vitals taken for this visit.  General Appearance: Well nourished, in no apparent distress. Eyes: PERRLA, EOMs, conjunctiva no swelling or erythema Sinuses: No Frontal/maxillary tenderness ENT/Mouth: Ext aud canals clear, TMs without erythema, bulging. No erythema, swelling, or exudate on post pharynx.  Tonsils not swollen or erythematous. Hearing normal.  Neck: Supple, thyroid normal.  Respiratory: Respiratory effort normal, BS equal bilaterally without rales,  rhonchi, wheezing or stridor.  Cardio: RRR with no MRGs. Brisk peripheral pulses without edema.  Abdomen: Soft, + BS.  Non tender, no guarding, rebound, hernias, masses. Lymphatics: Non tender without lymphadenopathy.  Musculoskeletal: Full ROM, 5/5 strength, normal gait.  Skin: Warm, dry without rashes, lesions, ecchymosis.  Neuro: Cranial nerves intact. Normal muscle tone, no cerebellar symptoms. Sensation intact.  Psych: Awake and oriented X 3, normal affect, Insight and Judgment appropriate.     Izora Ribas, NP 1:48 PM [facility]

## 2019-09-08 ENCOUNTER — Ambulatory Visit: Payer: 59 | Admitting: Adult Health

## 2019-11-06 NOTE — Progress Notes (Signed)
Assessment and Plan:  Teresa Summers was seen today for weight loss.  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 Don't focus on numbers for now, keep a journal and log how she is feeling about lifestyle changes implemented Follow general diet of reduced processed carbs, whole foods diet focused on lean proteins, fresh fruits/veggies, modest healthy fats and whole grains Follow up in 2 months Poor candidate for phentermine - consider topiramate for weight and migraine benefits at next OV  Prediabetes Discussed disease and risks Discussed diet/exercise, weight management  -     Hemoglobin A1c  Iron deficiency anemia, unspecified iron deficiency anemia type Historically with menorrhagia recently improved; monitor -     CBC with Differential/Platelet  PCOS (polycystic ovarian syndrome) Continue metformin, HBC; follows with Dr. Senaida Ores, will forward results  Discussed wieght loss, insulin resistance, low processed carbohydrate diet -     Hemoglobin A1c  Migraine without aura and without status migrainosus, not intractable Monitor for triggers, keep log; continue advil PRN; will try effexor for possible mood and migraine prophylaxis benefits -     venlafaxine XR (EFFEXOR XR) 37.5 MG 24 hr capsule; Take 1 cap by mouth daily for 2 weeks, then increase to 2 caps daily if tolerating well. For mood and migraines.  Medication management -     CBC with Differential/Platelet -     COMPLETE METABOLIC PANEL WITH GFR -     TSH  Major depressive disorder, single episode, moderate with anxious distress (HCC) Start new medication as prescribed Stress management techniques discussed, increase water, good sleep hygiene discussed, increase  exercise, and increase veggies.  Follow up 2 months, call the office if any new AE's from medications and we will switch them -     TSH -     venlafaxine XR (EFFEXOR XR) 37.5 MG 24 hr capsule; Take 1 cap by mouth daily for 2 weeks, then increase to 2 caps daily if tolerating well. For mood and migraines.  Further disposition pending results of labs. Discussed med's effects and SE's.   Over 30 minutes of exam, counseling, chart review, and critical decision making was performed.   Future Appointments  Date Time Provider Department Center  12/20/2019 11:00 AM Judd Gaudier, NP [facility] None    ------------------------------------------------------------------------------------------------------------------   HPI BP 128/84   Pulse (!) 108   Temp (!) 97.5 F (36.4 C)   Ht 5' (1.524 m)   Wt 232 lb 12.8 oz (105.6 kg)   SpO2 98%   BMI 45.47 kg/m   29 y.o.female with hx of morbid obesity, prediabetes, anxiety, tachycardia, lost to follow up last seen 05/2017 presents for evaluation of weight and depressed/anxious mood.   She reports hx of weight gain from 140 lb to 190 lb with her pregnancy, lost to 170 lb with breast feeding, but since was diagnosed with PCOS and prediabetes and has struggled with labile weight. Did lose weight with metformin and HBC initially, was following low carb diet and lost from 230s to 190s but then hit plateau. She also admits to chronic depressed mood and anxiety for several years, father also passed away unexpectedly last month. She is here per her mother's encouraging. PHQ-9 of 17 today. Denies SI/HI. Will go through phases of not eating anything (forgets) until dinner, quite irregular  diet. Enjoys pasta, but has done well with moderation previously (cutting back to weekly splurges, etc).   She additionally reports gets occasional migraines, every other week, R sided, throbbing character, pressure behind her eye, nausea takes aleve which does help most of the  time, but recently had episode lasting ~11 days.   BMI is Body mass index is 45.47 kg/m., she has not been working on diet and exercise. Wt Readings from Last 3 Encounters:  11/08/19 232 lb 12.8 oz (105.6 kg)  06/11/17 197 lb (89.4 kg)  09/22/16 238 lb 3.2 oz (108 kg)   Today their BP is BP: 128/84  She does not workout. She denies chest pain, shortness of breath, dizziness.   She does report hx of hyperlipidemia. The cholesterol last visit was:   Lab Results  Component Value Date   CHOL 197 12/09/2015   HDL 55 12/09/2015   LDLCALC 123 12/09/2015   TRIG 97 12/09/2015   CHOLHDL 3.6 12/09/2015    She has been working on diet and exercise for prediabetes and PCOS, follows with Dr. Marvel Plan, GYN at [place], on metformin 500 mg BID and Sprintec and denies increased appetite, nausea, paresthesia of the feet, polydipsia, polyuria, visual disturbances and vomiting. Last A1C in the office was:  Lab Results  Component Value Date   HGBA1C 6.0 (H) 12/09/2015   Patient is on Vitamin D supplement.   Lab Results  Component Value Date   VD25OH 12 (L) 12/09/2015       Past Medical History:  Diagnosis Date  . Allergy   . Asthma   . Obesity      No Known Allergies  Current Outpatient Medications on File Prior to Visit  Medication Sig  . metFORMIN (GLUCOPHAGE) 500 MG tablet Take by mouth 2 (two) times daily with a meal.  . norgestimate-ethinyl estradiol (SPRINTEC 28) 0.25-35 MG-MCG tablet Take 1 tablet by mouth daily.   No current facility-administered medications on file prior to visit.    ROS: all negative except above.   Physical Exam:  BP 128/84   Pulse (!) 108   Temp (!) 97.5 F (36.4 C)   Ht 5' (1.524 m)   Wt 232 lb 12.8 oz (105.6 kg)   SpO2 98%   BMI 45.47 kg/m   General Appearance: Well nourished, morbidly obese young female, in no apparent distress. Eyes: PERRLA, conjunctiva no swelling or erythema Sinuses: No Frontal/maxillary  tenderness ENT/Mouth: Ext aud canals clear, TMs without erythema, bulging. Mask in place; oral exam deferred. Hearing normal.  Neck: Supple, thyroid normal.  Respiratory: Respiratory effort normal, BS equal bilaterally without rales, rhonchi, wheezing or stridor.  Cardio: RRR with no MRGs. Brisk peripheral pulses without edema.  Abdomen: Soft, + BS.  Non tender, no guarding, rebound, hernias, masses. Lymphatics: Non tender without lymphadenopathy.  Musculoskeletal: Full ROM, 5/5 strength, normal gait.  Skin: Warm, dry without rashes, lesions, ecchymosis.  Neuro: Cranial nerves intact. Normal muscle tone, no cerebellar symptoms. Sensation intact.  Psych: Awake and oriented X 3, depressed affect, Insight and Judgment appropriate.     Izora Ribas, NP 12:26 PM [facility]

## 2019-11-08 ENCOUNTER — Other Ambulatory Visit: Payer: Self-pay

## 2019-11-08 ENCOUNTER — Ambulatory Visit (INDEPENDENT_AMBULATORY_CARE_PROVIDER_SITE_OTHER): Payer: BC Managed Care – PPO | Admitting: Adult Health

## 2019-11-08 ENCOUNTER — Encounter: Payer: Self-pay | Admitting: Adult Health

## 2019-11-08 DIAGNOSIS — R7303 Prediabetes: Secondary | ICD-10-CM

## 2019-11-08 DIAGNOSIS — E282 Polycystic ovarian syndrome: Secondary | ICD-10-CM | POA: Insufficient documentation

## 2019-11-08 DIAGNOSIS — F410 Panic disorder [episodic paroxysmal anxiety] without agoraphobia: Secondary | ICD-10-CM

## 2019-11-08 DIAGNOSIS — F321 Major depressive disorder, single episode, moderate: Secondary | ICD-10-CM

## 2019-11-08 DIAGNOSIS — D509 Iron deficiency anemia, unspecified: Secondary | ICD-10-CM

## 2019-11-08 DIAGNOSIS — G43909 Migraine, unspecified, not intractable, without status migrainosus: Secondary | ICD-10-CM | POA: Insufficient documentation

## 2019-11-08 DIAGNOSIS — G43009 Migraine without aura, not intractable, without status migrainosus: Secondary | ICD-10-CM

## 2019-11-08 DIAGNOSIS — Z79899 Other long term (current) drug therapy: Secondary | ICD-10-CM

## 2019-11-08 MED ORDER — VENLAFAXINE HCL ER 37.5 MG PO CP24: ORAL_CAPSULE | ORAL | 0 refills | Status: DC

## 2019-11-08 NOTE — Patient Instructions (Addendum)
Lose it! Or MyfitnessPAL  Keep a weekly journal about lifestyle changes - focus on how you are feeling   Only make changes that you feel good about and can keep up in the long run  Try to focus on eating something 3 meals a day, reduced processed foods, particularly carbs, focus on whole foods  If you are frustrated for feel irritable - not a good change  Effexor - start with 1 tab daily x 2 weeks, then increase to 2 tabs daily    Venlafaxine extended-release capsules What is this medicine? VENLAFAXINE(VEN la fax een) is used to treat depression, anxiety and panic disorder. This medicine may be used for other purposes; ask your health care provider or pharmacist if you have questions. COMMON BRAND NAME(S): Effexor XR What should I tell my health care provider before I take this medicine? They need to know if you have any of these conditions:  bleeding disorders  glaucoma  heart disease  high blood pressure  high cholesterol  kidney disease  liver disease  low levels of sodium in the blood  mania or bipolar disorder  seizures  suicidal thoughts, plans, or attempt; a previous suicide attempt by you or a family  take medicines that treat or prevent blood clots  thyroid disease  an unusual or allergic reaction to venlafaxine, desvenlafaxine, other medicines, foods, dyes, or preservatives  pregnant or trying to get pregnant  breast-feeding How should I use this medicine? Take this medicine by mouth with a full glass of water. Follow the directions on the prescription label. Do not cut, crush, or chew this medicine. Take it with food. If needed, the capsule may be carefully opened and the entire contents sprinkled on a spoonful of cool applesauce. Swallow the applesauce/pellet mixture right away without chewing and follow with a glass of water to ensure complete swallowing of the pellets. Try to take your medicine at about the same time each day. Do not take your  medicine more often than directed. Do not stop taking this medicine suddenly except upon the advice of your doctor. Stopping this medicine too quickly may cause serious side effects or your condition may worsen. A special MedGuide will be given to you by the pharmacist with each prescription and refill. Be sure to read this information carefully each time. Talk to your pediatrician regarding the use of this medicine in children. Special care may be needed. Overdosage: If you think you have taken too much of this medicine contact a poison control center or emergency room at once. NOTE: This medicine is only for you. Do not share this medicine with others. What if I miss a dose? If you miss a dose, take it as soon as you can. If it is almost time for your next dose, take only that dose. Do not take double or extra doses. What may interact with this medicine? Do not take this medicine with any of the following medications:  certain medicines for fungal infections like fluconazole, itraconazole, ketoconazole, posaconazole, voriconazole  cisapride  desvenlafaxine  dronedarone  duloxetine  levomilnacipran  linezolid  MAOIs like Carbex, Eldepryl, Marplan, Nardil, and Parnate  methylene blue (injected into a vein)  milnacipran  pimozide  thioridazine This medicine may also interact with the following medications:  amphetamines  aspirin and aspirin-like medicines  certain medicines for depression, anxiety, or psychotic disturbances  certain medicines for migraine headaches like almotriptan, eletriptan, frovatriptan, naratriptan, rizatriptan, sumatriptan, zolmitriptan  certain medicines for sleep  certain medicines  that treat or prevent blood clots like dalteparin, enoxaparin, warfarin  cimetidine  clozapine  diuretics  fentanyl  furazolidone  indinavir  isoniazid  lithium  metoprolol  NSAIDS, medicines for pain and inflammation, like ibuprofen or  naproxen  other medicines that prolong the QT interval (cause an abnormal heart rhythm) like dofetilide, ziprasidone  procarbazine  rasagiline  supplements like St. John's wort, kava kava, valerian  tramadol  tryptophan This list may not describe all possible interactions. Give your health care provider a list of all the medicines, herbs, non-prescription drugs, or dietary supplements you use. Also tell them if you smoke, drink alcohol, or use illegal drugs. Some items may interact with your medicine. What should I watch for while using this medicine? Tell your doctor if your symptoms do not get better or if they get worse. Visit your doctor or health care professional for regular checks on your progress. Because it may take several weeks to see the full effects of this medicine, it is important to continue your treatment as prescribed by your doctor. Patients and their families should watch out for new or worsening thoughts of suicide or depression. Also watch out for sudden changes in feelings such as feeling anxious, agitated, panicky, irritable, hostile, aggressive, impulsive, severely restless, overly excited and hyperactive, or not being able to sleep. If this happens, especially at the beginning of treatment or after a change in dose, call your health care professional. This medicine can cause an increase in blood pressure. Check with your doctor for instructions on monitoring your blood pressure while taking this medicine. You may get drowsy or dizzy. Do not drive, use machinery, or do anything that needs mental alertness until you know how this medicine affects you. Do not stand or sit up quickly, especially if you are an older patient. This reduces the risk of dizzy or fainting spells. Alcohol may interfere with the effect of this medicine. Avoid alcoholic drinks. Your mouth may get dry. Chewing sugarless gum, sucking hard candy and drinking plenty of water will help. Contact your doctor  if the problem does not go away or is severe. What side effects may I notice from receiving this medicine? Side effects that you should report to your doctor or health care professional as soon as possible:  allergic reactions like skin rash, itching or hives, swelling of the face, lips, or tongue  anxious  breathing problems  confusion  changes in vision  chest pain  confusion  elevated mood, decreased need for sleep, racing thoughts, impulsive behavior  eye pain  fast, irregular heartbeat  feeling faint or lightheaded, falls  feeling agitated, angry, or irritable  hallucination, loss of contact with reality  high blood pressure  loss of balance or coordination  palpitations  redness, blistering, peeling or loosening of the skin, including inside the mouth  restlessness, pacing, inability to keep still  seizures  stiff muscles  suicidal thoughts or other mood changes  trouble passing urine or change in the amount of urine  trouble sleeping  unusual bleeding or bruising  unusually weak or tired  vomiting Side effects that usually do not require medical attention (report to your doctor or health care professional if they continue or are bothersome):  change in sex drive or performance  change in appetite or weight  constipation  dizziness  dry mouth  headache  increased sweating  nausea  tired This list may not describe all possible side effects. Call your doctor for medical advice about side  effects. You may report side effects to FDA at 1-800-FDA-1088. Where should I keep my medicine? Keep out of the reach of children. Store at a controlled temperature between 20 and 25 degrees C (68 degrees and 77 degrees F), in a dry place. Throw away any unused medicine after the expiration date. NOTE: This sheet is a summary. It may not cover all possible information. If you have questions about this medicine, talk to your doctor, pharmacist, or  health care provider.  2020 [employer]vier/Gold Standard (2018-08-30 12:06:43)

## 2019-11-09 LAB — HEMOGLOBIN A1C
Hgb A1c MFr Bld: 5.6 % of total Hgb (ref <5.7)
Mean Plasma Glucose: 114 (calc)
eAG (mmol/L): 6.3 (calc)

## 2019-11-09 LAB — COMPLETE METABOLIC PANEL WITH GFR
AG Ratio: 1.1 (calc) (ref 1.0–2.5)
ALT: 9 U/L (ref 6–29)
AST: 11 U/L (ref 10–30)
Albumin: 3.7 g/dL (ref 3.6–5.1)
Alkaline phosphatase (APISO): 83 U/L (ref 31–125)
BUN: 13 mg/dL (ref 7–25)
CO2: 26 mmol/L (ref 20–32)
Calcium: 9.7 mg/dL (ref 8.6–10.2)
Chloride: 106 mmol/L (ref 98–110)
Creat: 0.64 mg/dL (ref 0.50–1.10)
GFR, Est African American: 143 mL/min/{1.73_m2} (ref >=60)
GFR, Est Non African American: 123 mL/min/{1.73_m2} (ref >=60)
Globulin: 3.4 g/dL (calc) (ref 1.9–3.7)
Glucose, Bld: 148 mg/dL — ABNORMAL HIGH (ref 65–99)
Potassium: 4.4 mmol/L (ref 3.5–5.3)
Sodium: 140 mmol/L (ref 135–146)
Total Bilirubin: 0.2 mg/dL (ref 0.2–1.2)
Total Protein: 7.1 g/dL (ref 6.1–8.1)

## 2019-11-09 LAB — CBC WITH DIFFERENTIAL/PLATELET
Absolute Monocytes: 422 cells/uL (ref 200–950)
Basophils Absolute: 48 cells/uL (ref 0–200)
Basophils Relative: 0.5 %
Eosinophils Absolute: 317 cells/uL (ref 15–500)
Eosinophils Relative: 3.3 %
HCT: 36.3 % (ref 35.0–45.0)
Hemoglobin: 11.7 g/dL (ref 11.7–15.5)
Lymphs Abs: 2592 cells/uL (ref 850–3900)
MCH: 24.6 pg — ABNORMAL LOW (ref 27.0–33.0)
MCHC: 32.2 g/dL (ref 32.0–36.0)
MCV: 76.3 fL — ABNORMAL LOW (ref 80.0–100.0)
MPV: 8.9 fL (ref 7.5–12.5)
Monocytes Relative: 4.4 %
Neutro Abs: 6221 cells/uL (ref 1500–7800)
Neutrophils Relative %: 64.8 %
Platelets: 431 10*3/uL — ABNORMAL HIGH (ref 140–400)
RBC: 4.76 10*6/uL (ref 3.80–5.10)
RDW: 14.1 % (ref 11.0–15.0)
Total Lymphocyte: 27 %
WBC: 9.6 10*3/uL (ref 3.8–10.8)

## 2019-11-09 LAB — TSH: TSH: 2 mIU/L

## 2019-12-01 ENCOUNTER — Other Ambulatory Visit: Payer: Self-pay | Admitting: Adult Health

## 2019-12-01 DIAGNOSIS — F321 Major depressive disorder, single episode, moderate: Secondary | ICD-10-CM

## 2019-12-01 DIAGNOSIS — G43009 Migraine without aura, not intractable, without status migrainosus: Secondary | ICD-10-CM

## 2019-12-01 MED ORDER — VENLAFAXINE HCL ER 75 MG PO CP24: ORAL_CAPSULE | ORAL | 1 refills | Status: DC

## 2019-12-18 NOTE — Progress Notes (Deleted)
Assessment and Plan:  Teresa Summers was seen today for weight loss.  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 And [employer]itness[employer] Don't focus on numbers for now, keep a journal and log how she is feeling about lifestyle changes implemented Follow general diet of reduced processed carbs, whole foods diet focused on lean proteins, fresh fruits/veggies, modest healthy fats and whole grains Follow up in 2 months Poor candidate for phentermine - consider topiramate for weight and migraine benefits at next OV  Migraine without aura and without status migrainosus, not intractable Monitor for triggers, keep log; continue advil PRN; will try effexor for possible mood and migraine prophylaxis benefits -     venlafaxine XR (EFFEXOR XR) 37.5 MG 24 hr capsule; Take 1 cap by mouth daily for 2 weeks, then increase to 2 caps daily if tolerating well. For mood and migraines.  Major depressive disorder, single episode, moderate with anxious distress (HCC) Start new medication as prescribed Stress management techniques discussed, increase water, good sleep hygiene discussed, increase exercise, and increase veggies.  Follow up 2 months, call the office if any new AE's from medications and we will switch them -     TSH -     venlafaxine XR (EFFEXOR XR) 37.5 MG 24 hr capsule; Take 1 cap by mouth daily for 2 weeks, then increase to 2 caps daily if tolerating well. For mood and migraines.  Further disposition pending results of labs. Discussed med's effects and SE's.   Over 30 minutes of exam, counseling, chart review, and critical decision making was performed.   Future Appointments  Date Time Provider Department Center  12/20/2019 11:00 AM Judd Gaudier, NP [facility]  None    ------------------------------------------------------------------------------------------------------------------   HPI There were no vitals taken for this visit.  29 y.o.female with hx of morbid obesity, prediabetes, anxiety, tachycardia, lost to follow up until recently presents for follow up of weight and depressed/anxious mood.   She reports hx of weight gain from 140 lb to 190 lb with her pregnancy, lost to 170 lb with breast feeding, but since was diagnosed with PCOS and prediabetes and has struggled with labile weight. Did lose weight with metformin and HBC initially, was following low carb diet and lost from 230s to 190s but then hit plateau. She also admits to chronic depressed mood and anxiety for several years, father also passed away unexpectedly early 2021, presented for evaluation per her mother's encouraging. PHQ-9 of 17 at last visit. Denied SI/HI.  Reported will go through phases of not eating anything (forgets) until dinner, quite irregular diet. Enjoys pasta, but has done well with moderation previously (cutting back to weekly splurges, etc).   She additionally reports gets occasional migraines, every other week, R sided, throbbing character, pressure behind her eye, nausea takes aleve which does help most of the time, but recently had episode lasting ~11 days.   She was initiated on effexor for possible benefit with depression/anxiety AND migraines, weight neutral.     BMI is There is no height or weight on file to calculate BMI., she has not been working on diet and exercise. Wt Readings from Last 3 Encounters:  11/08/19 232 lb 12.8 oz (105.6 kg)  06/11/17 197 lb (89.4 kg)  09/22/16 238 lb 3.2 oz (108 kg)   Today their BP is  She does not workout. She denies chest pain, shortness of breath, dizziness.   She does report hx of hyperlipidemia. The cholesterol last visit was:   Lab Results  Component Value Date   CHOL 197 12/09/2015   HDL 55 12/09/2015    LDLCALC 123 12/09/2015   TRIG 97 12/09/2015   CHOLHDL 3.6 12/09/2015    She has been working on diet and exercise for prediabetes and PCOS, follows with Dr. Marvel Plan, GYN at [facility], on metformin 500 mg BID and Sprintec and denies increased appetite, nausea, paresthesia of the feet, polydipsia, polyuria, visual disturbances and vomiting. Last A1C in the office was:  Lab Results  Component Value Date   HGBA1C 5.6 11/08/2019   Patient is *** on Vitamin D supplement.   Lab Results  Component Value Date   VD25OH 12 (L) 12/09/2015       Past Medical History:  Diagnosis Date  . Allergy   . Asthma   . Obesity      No Known Allergies  Current Outpatient Medications on File Prior to Visit  Medication Sig  . metFORMIN (GLUCOPHAGE) 500 MG tablet Take by mouth 2 (two) times daily with a meal.  . norgestimate-ethinyl estradiol (SPRINTEC 28) 0.25-35 MG-MCG tablet Take 1 tablet by mouth daily.  Marland Kitchen venlafaxine XR (EFFEXOR XR) 75 MG 24 hr capsule Take 1 cap by mouth daily for mood and migraines.   No current facility-administered medications on file prior to visit.    ROS: all negative except above.   Physical Exam:  There were no vitals taken for this visit.  General Appearance: Well nourished, morbidly obese young female, in no apparent distress. Eyes: PERRLA, conjunctiva no swelling or erythema Sinuses: No Frontal/maxillary tenderness ENT/Mouth: Ext aud canals clear, TMs without erythema, bulging. Mask in place; oral exam deferred. Hearing normal.  Neck: Supple, thyroid normal.  Respiratory: Respiratory effort normal, BS equal bilaterally without rales, rhonchi, wheezing or stridor.  Cardio: RRR with no MRGs. Brisk peripheral pulses without edema.  Abdomen: Soft, + BS.  Non tender, no guarding, rebound, hernias, masses. Lymphatics: Non tender without lymphadenopathy.  Musculoskeletal: Full ROM, 5/5 strength, normal gait.  Skin: Warm, dry without rashes,  lesions, ecchymosis.  Neuro: Cranial nerves intact. Normal muscle tone, no cerebellar symptoms. Sensation intact.  Psych: Awake and oriented X 3, depressed affect, Insight and Judgment appropriate.     Izora Ribas, NP 8:22 AM [facility]

## 2019-12-20 ENCOUNTER — Ambulatory Visit: Payer: BC Managed Care – PPO | Admitting: Adult Health

## 2020-01-18 DIAGNOSIS — F321 Major depressive disorder, single episode, moderate: Secondary | ICD-10-CM | POA: Insufficient documentation

## 2020-01-18 NOTE — Progress Notes (Deleted)
Assessment and Plan:  Amorie was seen today for weight loss.  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 And [employer] Don't focus on numbers for now, keep a journal and log how she is feeling about lifestyle changes implemented Follow general diet of reduced processed carbs, whole foods diet focused on lean proteins, fresh fruits/veggies, modest healthy fats and whole grains Follow up in 2 months Poor candidate for phentermine - consider topiramate for weight and migraine benefits at next OV  Migraine without aura and without status migrainosus, not intractable Monitor for triggers, keep log; continue advil PRN; will try effexor for possible mood and migraine prophylaxis benefits -     venlafaxine XR (EFFEXOR XR) 37.5 MG 24 hr capsule; Take 1 cap by mouth daily for 2 weeks, then increase to 2 caps daily if tolerating well. For mood and migraines.  Major depressive disorder, single episode, moderate with anxious distress (HCC) Start new medication as prescribed Stress management techniques discussed, increase water, good sleep hygiene discussed, increase exercise, and increase veggies.  Follow up 2 months, call the office if any new AE's from medications and we will switch them -     TSH -     venlafaxine XR (EFFEXOR XR) 37.5 MG 24 hr capsule; Take 1 cap by mouth daily for 2 weeks, then increase to 2 caps daily if tolerating well. For mood and migraines.  Further disposition pending results of labs. Discussed med's effects and SE's.   Over 30 minutes of exam, counseling, chart review, and critical decision making was performed.   Future Appointments  Date Time Provider Department Center  01/19/2020  9:30 AM Judd Gaudier, NP [facility]  None    ------------------------------------------------------------------------------------------------------------------   HPI There were no vitals taken for this visit.  29 y.o.female with hx of morbid obesity, prediabetes, anxiety, tachycardia, lost to follow up until recently presents for follow up of weight and depressed/anxious mood.   She reports hx of weight gain from 140 lb to 190 lb with her pregnancy, lost to 170 lb with breast feeding, but since was diagnosed with PCOS and prediabetes and has struggled with labile weight. Did lose weight with metformin and HBC initially, was following low carb diet and lost from 230s to 190s but then hit plateau. She also admits to chronic depressed mood and anxiety for several years, father also passed away unexpectedly early 2021, presented for evaluation per her mother's encouraging. PHQ-9 of 17 at last visit. Denied SI/HI.  Reported will go through phases of not eating anything (forgets) until dinner, quite irregular diet. Enjoys pasta, but has done well with moderation previously (cutting back to weekly splurges, etc).   She additionally reports gets occasional migraines, every other week, R sided, throbbing character, pressure behind her eye, nausea takes aleve which does help most of the time, but recently had episode lasting ~11 days.   She was initiated on effexor for possible benefit with depression/anxiety AND migraines, weight neutral. Lifestyle interventions may be more beneficial with mood better managed *** Consider topiramate ***    BMI is There is no height or weight on file to calculate BMI., she has not been working on diet and exercise. Wt Readings from Last 3 Encounters:  11/08/19 232 lb 12.8 oz (105.6 kg)  06/11/17 197 lb (89.4 kg)  09/22/16 238 lb 3.2 oz (108 kg)   Today their BP is    She does not workout. She denies chest pain, shortness of breath, dizziness.   She does report hx of hyperlipidemia. The cholesterol  last visit was:   Lab Results  Component Value Date   CHOL 197 12/09/2015   HDL 55 12/09/2015   LDLCALC 123 12/09/2015   TRIG 97 12/09/2015   CHOLHDL 3.6 12/09/2015    She has been working on diet and exercise for prediabetes and PCOS, follows with Dr. Marvel Plan, GYN at [facility], on metformin 500 mg BID and Sprintec and denies increased appetite, nausea, paresthesia of the feet, polydipsia, polyuria, visual disturbances and vomiting. Last A1C in the office was:  Lab Results  Component Value Date   HGBA1C 5.6 11/08/2019   Patient is *** on Vitamin D supplement.   Lab Results  Component Value Date   VD25OH 12 (L) 12/09/2015       Past Medical History:  Diagnosis Date  . Allergy   . Asthma   . Obesity      No Known Allergies  Current Outpatient Medications on File Prior to Visit  Medication Sig  . metFORMIN (GLUCOPHAGE) 500 MG tablet Take by mouth 2 (two) times daily with a meal.  . norgestimate-ethinyl estradiol (SPRINTEC 28) 0.25-35 MG-MCG tablet Take 1 tablet by mouth daily.  Marland Kitchen venlafaxine XR (EFFEXOR XR) 75 MG 24 hr capsule Take 1 cap by mouth daily for mood and migraines.   No current facility-administered medications on file prior to visit.    ROS: all negative except above.   Physical Exam:  There were no vitals taken for this visit.  General Appearance: Well nourished, morbidly obese young female, in no apparent distress. Eyes: PERRLA, conjunctiva no swelling or erythema Sinuses: No Frontal/maxillary tenderness ENT/Mouth: Ext aud canals clear, TMs without erythema, bulging. Mask in place; oral exam deferred. Hearing normal.  Neck: Supple, thyroid normal.  Respiratory: Respiratory effort normal, BS equal bilaterally without rales, rhonchi, wheezing or stridor.  Cardio: RRR with no MRGs. Brisk peripheral pulses without edema.  Abdomen: Soft, + BS.  Non tender, no guarding, rebound, hernias, masses. Lymphatics: Non tender without  lymphadenopathy.  Musculoskeletal: Full ROM, 5/5 strength, normal gait.  Skin: Warm, dry without rashes, lesions, ecchymosis.  Neuro: Cranial nerves intact. Normal muscle tone, no cerebellar symptoms. Sensation intact.  Psych: Awake and oriented X 3, depressed affect, Insight and Judgment appropriate.     Izora Ribas, NP 12:35 PM [facility]

## 2020-01-19 ENCOUNTER — Ambulatory Visit: Payer: BC Managed Care – PPO | Admitting: Adult Health

## 2020-01-25 NOTE — Progress Notes (Deleted)
Assessment and Plan:  Marquia was seen today for weight loss.  Diagnoses and all orders for this visit:  Morbid obesity ([place]) Morbid obesity complicated by moderate depression, PCOS Will initiate effexor for mood, weight neutral Discussed lifestyle at length; encouraged to slowly introduce sustainable lifestyle changes Short term logging with Lose it! And [employer] Don't focus on numbers for now, keep a journal and log how she is feeling about lifestyle changes implemented Follow general diet of reduced processed carbs, whole foods diet focused on lean proteins, fresh fruits/veggies, modest healthy fats and whole grains Follow up in 2 months Poor candidate for phentermine - consider topiramate for weight and migraine benefits at next OV  Migraine without aura and without status migrainosus, not intractable Monitor for triggers, keep log; continue advil PRN; will try effexor for possible mood and migraine prophylaxis benefits -     venlafaxine XR (EFFEXOR XR) 37.5 MG 24 hr capsule; Take 1 cap by mouth daily for 2 weeks, then increase to 2 caps daily if tolerating well. For mood and migraines.  Major depressive disorder, single episode, moderate with anxious distress ([place]) Start new medication as prescribed Stress management techniques discussed, increase water, good sleep hygiene discussed, increase exercise, and increase veggies.  Follow up 2 months, call the office if any new AE's from medications and we will switch them -     TSH -     venlafaxine XR (EFFEXOR XR) 37.5 MG 24 hr capsule; Take 1 cap by mouth daily for 2 weeks, then increase to 2 caps daily if tolerating well. For mood and migraines.  Further disposition pending results of labs. Discussed med's effects and SE's.   Over 30 minutes of exam, counseling, chart review, and critical decision making was performed.   Future Appointments  Date Time Provider [place]  01/29/2020  9:30 AM Liane Comber, NP [facility]  None    ------------------------------------------------------------------------------------------------------------------   HPI There were no vitals taken for this visit.  29 y.o.female with hx of morbid obesity, prediabetes, anxiety, tachycardia, lost to follow up until recently presents for follow up of weight and depressed/anxious mood.   She reports hx of weight gain from 140 lb to 190 lb with her pregnancy, lost to 170 lb with breast feeding, but since was diagnosed with PCOS and prediabetes and has struggled with labile weight. Did lose weight with metformin and HBC initially, was following low carb diet and lost from 230s to 190s but then hit plateau. She also admits to chronic depressed mood and anxiety for several years, father also passed away unexpectedly early 2021, presented for evaluation per her mother's encouraging. PHQ-9 of 17 at last visit. Denied SI/HI.  Reported will go through phases of not eating anything (forgets) until dinner, quite irregular diet. Enjoys pasta, but has done well with moderation previously (cutting back to weekly splurges, etc).   She additionally reports gets occasional migraines, every other week, R sided, throbbing character, pressure behind her eye, nausea takes aleve which does help most of the time, but recently had episode lasting ~11 days.   She was initiated on effexor for possible benefit with depression/anxiety AND migraines, weight neutral. Lifestyle interventions may be more beneficial with mood better managed *** Consider topiramate ***    BMI is There is no height or weight on file to calculate BMI., she has not been working on diet and exercise. Wt Readings from Last 3 Encounters:  11/08/19 232 lb 12.8 oz (105.6 kg)  06/11/17 197 lb (89.4 kg)  09/22/16 238 lb 3.2 oz (108 kg)   Today their BP is    She does not workout. She denies chest pain, shortness of breath, dizziness.   She does report hx of hyperlipidemia. The cholesterol  last visit was:   Lab Results  Component Value Date   CHOL 197 12/09/2015   HDL 55 12/09/2015   LDLCALC 123 12/09/2015   TRIG 97 12/09/2015   CHOLHDL 3.6 12/09/2015    She has been working on diet and exercise for prediabetes and PCOS, follows with Dr. Senaida Ores, GYN at [facility], on metformin 500 mg BID and Sprintec and denies increased appetite, nausea, paresthesia of the feet, polydipsia, polyuria, visual disturbances and vomiting. Last A1C in the office was:  Lab Results  Component Value Date   HGBA1C 5.6 11/08/2019   Patient is *** on Vitamin D supplement.   Lab Results  Component Value Date   VD25OH 12 (L) 12/09/2015       Past Medical History:  Diagnosis Date  . Allergy   . Asthma   . Obesity      No Known Allergies  Current Outpatient Medications on File Prior to Visit  Medication Sig  . metFORMIN (GLUCOPHAGE) 500 MG tablet Take by mouth 2 (two) times daily with a meal.  . norgestimate-ethinyl estradiol (SPRINTEC 28) 0.25-35 MG-MCG tablet Take 1 tablet by mouth daily.  Marland Kitchen venlafaxine XR (EFFEXOR XR) 75 MG 24 hr capsule Take 1 cap by mouth daily for mood and migraines.   No current facility-administered medications on file prior to visit.    ROS: all negative except above.   Physical Exam:  There were no vitals taken for this visit.  General Appearance: Well nourished, morbidly obese young female, in no apparent distress. Eyes: PERRLA, conjunctiva no swelling or erythema Sinuses: No Frontal/maxillary tenderness ENT/Mouth: Ext aud canals clear, TMs without erythema, bulging. Mask in place; oral exam deferred. Hearing normal.  Neck: Supple, thyroid normal.  Respiratory: Respiratory effort normal, BS equal bilaterally without rales, rhonchi, wheezing or stridor.  Cardio: RRR with no MRGs. Brisk peripheral pulses without edema.  Abdomen: Soft, + BS.  Non tender, no guarding, rebound, hernias, masses. Lymphatics: Non tender without  lymphadenopathy.  Musculoskeletal: Full ROM, 5/5 strength, normal gait.  Skin: Warm, dry without rashes, lesions, ecchymosis.  Neuro: Cranial nerves intact. Normal muscle tone, no cerebellar symptoms. Sensation intact.  Psych: Awake and oriented X 3, depressed affect, Insight and Judgment appropriate.     Dan Maker, NP 5:20 PM [facility]

## 2020-01-29 ENCOUNTER — Ambulatory Visit: Payer: BC Managed Care – PPO | Admitting: Adult Health

## 2020-01-29 DIAGNOSIS — F321 Major depressive disorder, single episode, moderate: Secondary | ICD-10-CM

## 2020-01-29 DIAGNOSIS — Z0000 Encounter for general adult medical examination without abnormal findings: Secondary | ICD-10-CM

## 2020-01-29 DIAGNOSIS — F410 Panic disorder [episodic paroxysmal anxiety] without agoraphobia: Secondary | ICD-10-CM

## 2020-01-29 DIAGNOSIS — G43009 Migraine without aura, not intractable, without status migrainosus: Secondary | ICD-10-CM

## 2020-10-07 NOTE — Progress Notes (Deleted)
[facility] follow up  Assessment and Plan: Hospital visit follow up for palpatations    All medications were reviewed with patient and family and fully reconciled. All questions answered fully, and patient and family members were encouraged to call the office with any further questions or concerns. Discussed goal to avoid readmission related to this diagnosis.  There are no discontinued medications.  CAN NOT DO FOR BCBS REGULAR OR MEDICARE Over 40 minutes of exam, counseling, chart review, and complex, high/moderate level critical decision making was performed this visit.   Future Appointments  Date Time Provider Department Center  10/08/2020  3:30 PM Elder Negus, NP [facility] None     HPI 30 y.o.female presents for follow up for transition from recent hospitalization or SNIF stay. Admit date to the hospital was 08/27/20, patient was discharged from the hospital on 08/27/20. and our clinical staff contacted the office the day after discharge to set up a follow up appointment. The discharge summary, medications, and diagnostic test results were reviewed before meeting with the patient. The patient was admitted for: palpitations She has a 30day monitor placed 12/09/2.  She also has a follow up with cardiology at that time.  Results from monitor:  CPACS - 09/29/2020 9:03 AM EST  Event monitor result: The rhythm was sinus (min HR was 67 bpm, max HR was 142 bpm) There were rare PVCs and no PACs There was no atrial fibrillation There were no ventricular arrhythmias There were no significant pauses -John Powers    Home health {ACTION; IS/IS [id number]} involved.   Images while in the hospital: CT ABDOMEN PELVIS W CONTRAST  Result Date: 12/05/2016 CLINICAL DATA:  Lambert Mody right lower quadrant pain and vomiting. EXAM: CT ABDOMEN AND PELVIS WITH CONTRAST TECHNIQUE: Multidetector CT imaging of the abdomen and pelvis was performed using the standard protocol following bolus  administration of intravenous contrast. CONTRAST:  100 mL Isovue-300 COMPARISON:  None. FINDINGS: Lower chest: No pulmonary nodules. No visible pleural or pericardial effusion. Hepatobiliary: Normal hepatic size and contours without focal liver lesion. No perihepatic ascites. No intra- or extrahepatic biliary dilatation. Gallbladder surgically absent. Pancreas: Normal pancreatic contours and enhancement. No peripancreatic fluid collection or pancreatic ductal dilatation. Spleen: Normal. Adrenals/Urinary Tract: Normal adrenal glands. No hydronephrosis or solid renal mass. Stomach/Bowel: No abnormal bowel dilatation. No bowel wall thickening or adjacent fat stranding to indicate acute inflammation. No abdominal fluid collection. Normal appendix. Vascular/Lymphatic: Normal course and caliber of the major abdominal vessels. No abdominal or pelvic lymphadenopathy. Reproductive: There is fluid within the endometrial cavity. There is a peripherally enhancing low-density focus within the right ovary. Left ovary is normal. No free fluid in the pelvis. Musculoskeletal: No lytic or blastic osseous lesion. Normal visualized extrathoracic and extraperitoneal soft tissues. Other: No contributory non-categorized findings. IMPRESSION: 1. Normal appendix. 2. Peripherally enhancing cystic focus of the right ovary, possibly corpus luteum cyst or hemorrhagic cyst. No free fluid in the pelvis. Pelvic ultrasound could be considered for further evaluation, if clinically indicated. Electronically Signed   By: Deatra Robinson M.D.   On: 12/05/2016 05:20     Current Outpatient Medications (Endocrine & Metabolic):  .  metFORMIN (GLUCOPHAGE) 500 MG tablet, Take by mouth 2 (two) times daily with a meal. .  norgestimate-ethinyl estradiol (SPRINTEC 28) 0.25-35 MG-MCG tablet, Take 1 tablet by mouth daily.      Current Outpatient Medications (Other):  .  venlafaxine XR (EFFEXOR XR) 75 MG 24 hr capsule, Take 1 cap by mouth daily  for mood  and migraines.  Past Medical History:  Diagnosis Date  . Allergy   . Asthma   . Obesity      No Known Allergies  ROS: all negative except above.   Physical Exam: There were no vitals filed for this visit. There were no vitals taken for this visit. General Appearance: Well nourished, in no apparent distress. Eyes: PERRLA, EOMs, conjunctiva no swelling or erythema Sinuses: No Frontal/maxillary tenderness ENT/Mouth: Ext aud canals clear, TMs without erythema, bulging. No erythema, swelling, or exudate on post pharynx.  Tonsils not swollen or erythematous. Hearing normal.  Neck: Supple, thyroid normal.  Respiratory: Respiratory effort normal, BS equal bilaterally without rales, rhonchi, wheezing or stridor.  Cardio: RRR with no MRGs. Brisk peripheral pulses without edema.  Abdomen: Soft, + BS.  Non tender, no guarding, rebound, hernias, masses. Lymphatics: Non tender without lymphadenopathy.  Musculoskeletal: Full ROM, 5/5 strength, normal gait.  Skin: Warm, dry without rashes, lesions, ecchymosis.  Neuro: Cranial nerves intact. Normal muscle tone, no cerebellar symptoms. Sensation intact.  Psych: Awake and oriented X 3, normal affect, Insight and Judgment appropriate.     Elder Negus, NP 8:27 PM [facility]

## 2020-10-08 ENCOUNTER — Ambulatory Visit: Payer: Self-pay | Admitting: Adult Health Nurse Practitioner

## 2020-10-08 ENCOUNTER — Ambulatory Visit: Payer: BC Managed Care – PPO | Admitting: Adult Health Nurse Practitioner

## 2020-10-08 DIAGNOSIS — I10 Essential (primary) hypertension: Secondary | ICD-10-CM

## 2020-10-08 DIAGNOSIS — R002 Palpitations: Secondary | ICD-10-CM

## 2021-06-02 ENCOUNTER — Telehealth: Payer: Self-pay | Admitting: Genetic Counselor

## 2021-06-02 NOTE — Telephone Encounter (Signed)
Scheduled appt per 9/1 referral. Pt is aware of appt date and time.  

## 2021-06-09 NOTE — Progress Notes (Deleted)
EFERRING PROVIDER: Lucky Cowboy, MD [address] [place],  [place] [address]  PRIMARY PROVIDER:  Lucky Cowboy, MD  PRIMARY REASON FOR VISIT:  ***   HISTORY OF PRESENT ILLNESS:   Teresa Summers, a 30 y.o. female, was seen for a [place] cancer genetics consultation at the request of Dr. Oneta Rack due to a {Personal/family:20331} history of {cancer/polyps}.  Teresa Summers presents to clinic today to discuss the possibility of a hereditary predisposition to cancer, to discuss genetic testing, and to further clarify her future cancer risks, as well as potential cancer risks for family members.   Teresa Summers is a 30 y.o. female with no personal history of cancer.    CANCER HISTORY:  Oncology History   No history exists.     RISK FACTORS:  Menarche was at age ***.  First live birth at age ***.  OCP use for approximately {Numbers 1-12 multi-select:20307} years.  Ovaries intact: {Yes/No-Ex:120004}.  Hysterectomy: {Yes/No-Ex:120004}.  Menopausal status: {Menopause:31378}.  HRT use: {Numbers 1-12 multi-select:20307} years. Colonoscopy: {Yes/No-Ex:120004}; {normal/abnormal/not examined:14677}. Mammogram within the last year: {Yes/No-Ex:120004}. Number of breast biopsies: {Numbers 1-12 multi-select:20307}. Up to date with pelvic exams: {Yes/No-Ex:120004}. Any excessive radiation exposure in the past: {Yes/No-Ex:120004}  Past Medical History:  Diagnosis Date   Allergy    Asthma    Obesity     Past Surgical History:  Procedure Laterality Date   abcess tooth  2010   CHOLECYSTECTOMY  10/2011   wisdom teeth  2009    Social History   Socioeconomic History   Marital status: Single    Spouse name: Not on file   Number of children: Not on file   Years of education: Not on file   Highest education level: Not on file  Occupational History   Not on file  Tobacco Use   Smoking status: Never   Smokeless tobacco: Never  Substance and Sexual Activity   Alcohol  use: Yes    Alcohol/week: 0.0 standard drinks    Comment: once a month   Drug use: No   Sexual activity: Not Currently    Partners: Male    Comment: needs to get on BCP  Other Topics Concern   Not on file  Social History Narrative   Not on file   Social Determinants of Health   Financial Resource Strain: Not on file  Food Insecurity: Not on file  Transportation Needs: Not on file  Physical Activity: Not on file  Stress: Not on file  Social Connections: Not on file     FAMILY HISTORY:  We obtained a detailed, 4-generation family history.  Significant diagnoses are listed below: Family History  Adopted: Yes    Teresa Summers is {aware/unaware} of previous family history of genetic testing for hereditary cancer risks. Patient's maternal ancestors are of *** descent, and paternal ancestors are of *** descent. There {IS NO:12509} reported Ashkenazi Jewish ancestry. There {IS NO:12509} known consanguinity.  GENETIC COUNSELING ASSESSMENT: Teresa Summers is a 30 y.o. female with a {Personal/family:20331} history of {cancer/polyps} which is somewhat suggestive of a {DISEASE} and predisposition to cancer given ***. We, therefore, discussed and recommended the following at today's visit.   DISCUSSION: We discussed that *** - ***% of *** is hereditary, with most cases of hereditary *** cancer associated with ***.  There are other genes that can be associated with hereditary *** cancer syndromes.  These include ***.  We discussed that testing is beneficial for several reasons, including knowing about other cancer risks, identifying potential  screening and risk-reduction options that may be appropriate, and to understanding if other family members could be at risk for cancer and allowing them to undergo genetic testing.  We reviewed the characteristics, features and inheritance patterns of hereditary cancer syndromes. We also discussed genetic testing, including the appropriate family members to test,  the process of testing, insurance coverage and turn-around-time for results. We discussed the implications of a negative, positive, carrier and/or variant of uncertain significant result. We discussed that negative results would be uninformative given that Teresa Summers does not have a personal history of cancer. We recommended Teresa Summers pursue genetic testing for a panel that contains genes associated with ***.  Teresa Summers was offered a common hereditary cancer panel (48 genes) and an expanded pan-cancer panel (85 genes). Teresa Summers was informed of the benefits and limitations of each panel, including that expanded pan-cancer panels contain several genes that do not have clear management guidelines at this point in time.  We also discussed that as the number of genes included on a panel increases, the chances of variants of uncertain significance increases.  After considering the benefits and limitations of each gene panel, Teresa Summers elected to have an *** through ***.   Based on Teresa Summers's {Personal/family:20331} history of cancer, she meets medical criteria for genetic testing. Despite that she meets criteria, she may still have an out of pocket cost. We discussed that if her out of pocket cost for testing is over $100, the laboratory will call and confirm whether she wants to proceed with testing.  If the out of pocket cost of testing is less than $100 she will be billed by the genetic testing laboratory.   ***We reviewed the characteristics, features and inheritance patterns of hereditary cancer syndromes. We also discussed genetic testing, including the appropriate family members to test, the process of testing, insurance coverage and turn-around-time for results. We discussed the implications of a negative, positive and/or variant of uncertain significant result. In order to get genetic test results in a timely manner so that Teresa Summers can use these genetic test results for surgical decisions,  we recommended Teresa Summers pursue genetic testing for the ***. Once complete, we recommend Teresa Summers pursue reflex genetic testing to the *** gene panel.   Based on Teresa Summers's {Personal/family:20331} history of cancer, she meets medical criteria for genetic testing. Despite that she meets criteria, she may still have an out of pocket cost.   ***We discussed with Teresa Summers that the {Personal/family:20331} history does not meet insurance or NCCN criteria for genetic testing and, therefore, is not highly consistent with a familial hereditary cancer syndrome.  We feel she is at low risk to harbor a gene mutation associated with such a condition. Thus, we did not recommend any genetic testing, at this time, and recommended Teresa Summers continue to follow the cancer screening guidelines given by her primary healthcare provider.  ***In order to estimate her chance of having a {CA GENE:62345} mutation, we used statistical models ({GENMODELS:62370}) that consider her personal medical history, family history and ancestry.  Because each model is different, there can be a lot of variability in the risks they give.  Therefore, these numbers must be considered a rough range and not a precise risk of having a {CA GENE:62345} mutation.  These models estimate that she has approximately a ***-***% chance of having a mutation. Based on this assessment of her family and personal history, genetic testing {IS/ISNOT:34056} recommended.  ***Based on the patient's {  Personal/family:20331} history, a statistical model ({GENMODELS:62370}) was used to estimate her risk of developing {CA HX:54794}. This estimates her lifetime risk of developing {CA HX:54794} to be approximately ***%. This estimation does not consider any genetic testing results.  The patient's lifetime breast cancer risk is a preliminary estimate based on available information using one of several models endorsed by the American Cancer Society (ACS). The ACS  recommends consideration of breast MRI screening as an adjunct to mammography for patients at high risk (defined as 20% or greater lifetime risk).   ***Teresa Summers has been determined to be at high risk for breast cancer.  Therefore, we recommend that annual screening with mammography and breast MRI be performed.  ***begin at age 27, or 10 years prior to the age of breast cancer diagnosis in a relative (whichever is earlier).  We discussed that Teresa Summers should discuss her individual situation with her referring physician and determine a breast cancer screening plan with which they are both comfortable.    We discussed that some people do not want to undergo genetic testing due to fear of genetic discrimination.  A federal law called the Genetic Information Non-Discrimination Act (GINA) of 2008 helps protect individuals against genetic discrimination based on their genetic test results.  It impacts both health insurance and employment.  With health insurance, it protects against increased premiums, being kicked off insurance or being forced to take a test in order to be insured.  For employment it protects against hiring, firing and promoting decisions based on genetic test results.  GINA does not apply to those in the [employer], those who work for companies with less than 15 employees, and new life insurance or long-term disability insurance policies.  Health status due to a cancer diagnosis is not protected under GINA.  PLAN: After considering the risks, benefits, and limitations, Ms. Dsouza provided informed consent to pursue genetic testing and the blood sample was sent to {Lab} Laboratories for analysis of the {test}. Results should be available within approximately {TAT TIME} weeks' time, at which point they will be disclosed by telephone to Ms. Oscarson, as will any additional recommendations warranted by these results. Ms. Bruno will receive a summary of her genetic counseling visit and a copy of  her results once available. This information will also be available in Epic.   *** Despite our recommendation, Ms. Kisiel did not wish to pursue genetic testing at today's visit. We understand this decision and remain available to coordinate genetic testing at any time in the future. We, therefore, recommend Ms. Merkley continue to follow the cancer screening guidelines given by her primary healthcare provider.  ***Based on Ms. Aquilino's family history, we recommended her ***, who was diagnosed with *** at age ***, have genetic counseling and testing. Ms. Rosemeyer will let us know if we can be of any assistance in coordinating genetic counseling and/or testing for this family member.   Lastly, we encouraged Ms. Manuele to remain in contact with cancer genetics annually so that we can continuously update the family history and inform her of any changes in cancer genetics and testing that may be of benefit for this family.   Ms. Boorman questions were answered to her satisfaction today. Our contact information was provided should additional questions or concerns arise. Thank you for the referral and allowing [place] to share in the care of your patient.   Lalla Brothers, MS, [facility] Genetic Counselor [email or web address] (P) [phone]  The patient was seen for a total of ***  minutes in face-to-face genetic counseling.  ***The patient brought ***.  ***The patient was seen alone.  Drs. Magrinat, Pamelia Hoit and/or Mosetta Putt were available to discuss this case as needed.  _______________________________________________________________________ For Office Staff:  Number of people involved in session: *** Was an Intern/ student involved with case: {YES/NO:63}

## 2021-06-10 ENCOUNTER — Inpatient Hospital Stay: Payer: BLUE CROSS/BLUE SHIELD | Admitting: Genetic Counselor

## 2021-06-10 ENCOUNTER — Inpatient Hospital Stay: Payer: BLUE CROSS/BLUE SHIELD

## 2021-06-22 ENCOUNTER — Other Ambulatory Visit: Payer: Self-pay | Admitting: Internal Medicine

## 2021-06-22 MED ORDER — BENZONATATE 200 MG PO CAPS: ORAL_CAPSULE | ORAL | 1 refills | Status: DC

## 2021-06-22 MED ORDER — DEXAMETHASONE 4 MG PO TABS: ORAL_TABLET | ORAL | 0 refills | Status: DC

## 2021-06-22 MED ORDER — PSEUDOEPHEDRINE HCL ER 120 MG PO TB12: ORAL_TABLET | ORAL | 3 refills | Status: DC

## 2021-06-24 ENCOUNTER — Inpatient Hospital Stay: Payer: BLUE CROSS/BLUE SHIELD

## 2021-06-24 ENCOUNTER — Encounter: Payer: Self-pay | Admitting: Genetic Counselor

## 2021-06-24 ENCOUNTER — Inpatient Hospital Stay: Payer: BLUE CROSS/BLUE SHIELD | Attending: Genetic Counselor | Admitting: Genetic Counselor

## 2021-06-24 NOTE — Progress Notes (Deleted)
REFERRING PROVIDER: Janyth Contes, MD [address], [place] [address]  PRIMARY PROVIDER:  Unk Pinto, MD  PRIMARY REASON FOR VISIT:     HISTORY OF PRESENT ILLNESS:   Teresa Summers, a 30 y.o. female, was seen for a [facility] cancer genetics consultation at the request of Dr. Rica Koyanagi due to a family history of cancer.  Teresa Summers presents to clinic today to discuss the possibility of a hereditary predisposition to cancer, to discuss genetic testing, and to further clarify her future cancer risks, as well as potential cancer risks for family members.   Teresa Summers is a 29 y.o. female with no personal history of cancer.    CANCER HISTORY:  Oncology History   No history exists.    RISK FACTORS:  Menarche was at age ***.  First live birth at age ***.  OCP use for approximately {Numbers 1-12 multi-select:20307} years.  Ovaries intact: yes.  Uterus intact: yes.  Menopausal status: premenopausal.  HRT use: 0 years. Colonoscopy: no Mammogram within the last year: no. Number of breast biopsies: 0. Up to date with pelvic exams: yes. Any excessive radiation exposure in the past: no  Past Medical History:  Diagnosis Date   Allergy    Asthma    Obesity     Past Surgical History:  Procedure Laterality Date   abcess tooth  2010   CHOLECYSTECTOMY  10/2011   wisdom teeth  2009    Social History   Socioeconomic History   Marital status: Single    Spouse name: Not on file   Number of children: Not on file   Years of education: Not on file   Highest education level: Not on file  Occupational History   Not on file  Tobacco Use   Smoking status: Never   Smokeless tobacco: Never  Substance and Sexual Activity   Alcohol use: Yes    Alcohol/week: 0.0 standard drinks    Comment: once a month   Drug use: No   Sexual activity: Not Currently    Partners: Male    Comment: needs to get on BCP  Other Topics Concern   Not on file  Social History  Narrative   Not on file   Social Determinants of Health   Financial Resource Strain: Not on file  Food Insecurity: Not on file  Transportation Needs: Not on file  Physical Activity: Not on file  Stress: Not on file  Social Connections: Not on file     FAMILY HISTORY:  We obtained a detailed, 4-generation family history.  Significant diagnoses are listed below:   [name] is {aware/unaware} of previous family history of genetic testing for hereditary cancer risks. Patient's maternal ancestors are of *** descent, and paternal ancestors are of *** descent. There {IS NO:12509} reported Ashkenazi Jewish ancestry. There {IS NO:12509} known consanguinity.  GENETIC COUNSELING ASSESSMENT: [name] is a 30 y.o. female with a family history of cancer which is somewhat suggestive of a {DISEASE} and predisposition to cancer given ***. We, therefore, discussed and recommended the following at today's visit.   DISCUSSION: We discussed that 5 - 10% of all cancer is hereditary but 20% of ovarian cancer is hereditary with most cases of hereditary ovarian cancer associated with BRCA1/2.  There are other genes that can be associated with hereditary ovarian cancer syndromes.  We discussed that testing is beneficial for several reasons, including knowing about other cancer risks, identifying potential screening and risk-reduction options that may be  appropriate, and to understanding if other family members could be at risk for cancer and allowing them to undergo genetic testing.  We reviewed the characteristics, features and inheritance patterns of hereditary cancer syndromes. We also discussed genetic testing, including the appropriate family members to test, the process of testing, insurance coverage and turn-around-time for results. We discussed the implications of a negative, positive, carrier and/or variant of uncertain significant result. We discussed that negative results would be uninformative given  that Teresa Summers does not have a personal history of cancer. We recommended Teresa Summers pursue genetic testing for a panel that contains genes associated with ***.  Teresa Summers was offered a common hereditary cancer panel (48 genes) and an expanded pan-cancer panel (85 genes). Teresa Summers was informed of the benefits and limitations of each panel, including that expanded pan-cancer panels contain several genes that do not have clear management guidelines at this point in time.  We also discussed that as the number of genes included on a panel increases, the chances of variants of uncertain significance increases.  After considering the benefits and limitations of each gene panel, Teresa Summers elected to have an *** through ***.   Based on Teresa Summers's family history of cancer, she meets medical criteria for genetic testing. Despite that she meets criteria, she may still have an out of pocket cost. We discussed that if her out of pocket cost for testing is over $100, the laboratory will call and confirm whether she wants to proceed with testing.  If the out of pocket cost of testing is less than $100 she will be billed by the genetic testing laboratory.   ***We reviewed the characteristics, features and inheritance patterns of hereditary cancer syndromes. We also discussed genetic testing, including the appropriate family members to test, the process of testing, insurance coverage and turn-around-time for results. We discussed the implications of a negative, positive and/or variant of uncertain significant result. In order to get genetic test results in a timely manner so that Teresa Summers can use these genetic test results for surgical decisions, we recommended Teresa Summers pursue genetic testing for the ***. Once complete, we recommend Teresa Summers pursue reflex genetic testing to the *** gene panel.   Based on Teresa Summers's {Personal/family:20331} history of cancer, she meets medical criteria for genetic  testing. Despite that she meets criteria, she may still have an out of pocket cost.   ***We discussed with Teresa Summers that the {Personal/family:20331} history does not meet insurance or NCCN criteria for genetic testing and, therefore, is not highly consistent with a familial hereditary cancer syndrome.  We feel she is at low risk to harbor a gene mutation associated with such a condition. Thus, we did not recommend any genetic testing, at this time, and recommended Teresa Summers continue to follow the cancer screening guidelines given by her primary healthcare provider.  We discussed that some people do not want to undergo genetic testing due to fear of genetic discrimination.  A federal law called the Genetic Information Non-Discrimination Act (GINA) of 2008 helps protect individuals against genetic discrimination based on their genetic test results.  It impacts both health insurance and employment.  With health insurance, it protects against increased premiums, being kicked off insurance or being forced to take a test in order to be insured.  For employment it protects against hiring, firing and promoting decisions based on genetic test results.  GINA does not apply to those in the [employer], those who work for companies  with less than 15 employees, and new life insurance or long-term disability Engineer, structural.  Health status due to a cancer diagnosis is not protected under GINA.  PLAN: After considering the risks, benefits, and limitations, Teresa Summers provided informed consent to pursue genetic testing and the blood sample was sent to {Lab} Laboratories for analysis of the {test}. Results should be available within approximately 2-3 weeks' time, at which point they will be disclosed by telephone to Teresa Summers, as will any additional recommendations warranted by these results. Teresa Summers will receive a summary of her genetic counseling visit and a copy of her results once available. This information  will also be available in Epic.   *** Despite our recommendation, Ms. Murtaugh did not wish to pursue genetic testing at today's visit. We understand this decision and remain available to coordinate genetic testing at any time in the future. We, therefore, recommend Ms. Simonin continue to follow the cancer screening guidelines given by her primary healthcare provider.  ***Based on Ms. Manship's family history, we recommended her ***, who was diagnosed with *** at age ***, have genetic counseling and testing. Ms. Pernice will let us know if we can be of any assistance in coordinating genetic counseling and/or testing for this family member.   Lastly, we encouraged Ms. Bardin to remain in contact with cancer genetics annually so that we can continuously update the family history and inform her of any changes in cancer genetics and testing that may be of benefit for this family.   Ms. Bieker questions were answered to her satisfaction today. Our contact information was provided should additional questions or concerns arise. Thank you for the referral and allowing [place] to share in the care of your patient.   Teresa Passy, MS, [facility] Genetic Counselor [email or web address] (P) [phone]  The patient was seen for a total of *** minutes in face-to-face genetic counseling.  ***The patient brought ***.  ***The patient was seen alone.  Drs. Magrinat, Lindi Adie and/or Burr Medico were available to discuss this case as needed.  _______________________________________________________________________ For Office Staff:  Number of people involved in session: *** Was an Intern/ student involved with case: {YES/NO:63}

## 2021-08-19 ENCOUNTER — Telehealth: Payer: Self-pay

## 2021-08-19 ENCOUNTER — Other Ambulatory Visit: Payer: Self-pay | Admitting: Adult Health

## 2021-08-19 DIAGNOSIS — R197 Diarrhea, unspecified: Secondary | ICD-10-CM

## 2021-08-19 NOTE — Telephone Encounter (Signed)
States that she was seen at Urgent Care and the doctor recommended to call our office and order a stool test to be processed by her next office visit on December 12th. Please advise.

## 2021-08-22 NOTE — Telephone Encounter (Signed)
Left message on voice mail  to call back

## 2021-08-25 NOTE — Telephone Encounter (Signed)
Left message on voice mail  to call back

## 2021-08-27 NOTE — Telephone Encounter (Signed)
Patient is scheduled to come in on 12/09 to pick up kit from the lab

## 2021-08-27 NOTE — Progress Notes (Signed)
Complete Physical  Assessment and Plan: Teresa Summers was seen today for annual exam.  Diagnoses and all orders for this visit:  Encounter for general adult medical examination with abnormal findings Due Yearly  Major depressive disorder, single episode, moderate with anxious distress (HCC) Currently controlled without medication Continue behavior modification, diet and exercise  Morbid obesity (HCC) Long discussion about weight loss, diet, and exercise Recommended diet heavy in fruits and veggies and low in animal meats, cheeses, and dairy products, appropriate calorie intake Follow up at next visit  -     COMPLETE METABOLIC PANEL WITH GFR -     TSH  Migraine without aura and without status migrainosus, not intractable Well Controlled without medication  Iron deficiency anemia, unspecified iron deficiency anemia type -     CBC with Differential/Platelet  PCOS (polycystic ovarian syndrome) Continue Metformin, diet and exercise  Medication management -     Magnesium  Abnormal glucose -     Hemoglobin A1c  Screening, lipid -     Lipid panel  Screening for hematuria or proteinuria -     Urinalysis, Routine w reflex microscopic -     Microalbumin / creatinine urine ratio  Screening for thyroid disorder -     TSH  Vitamin D deficiency -     VITAMIN D 25 Hydroxy (Vit-D Deficiency, Fractures)  Diarrhea, unspecified type -     Ova and parasite examination;  Push fluids. Eat soft diet, hold Metformin for now until work up of diarrhea is completed Keep GI appointment   Tonsillitis Complete antibiotic prescribed through urgent care   Discussed med's effects and SE's. Screening labs and tests as requested with regular follow-up as recommended. Over 40 minutes of exam, counseling, chart review, and complex, high level critical decision making was performed this visit.   HPI  30 y.o. female  presents for a complete physical and follow up for has Morbid obesity (HCC);  Allergy; Panic attack; Iron deficiency anemia; Prediabetes; PCOS (polycystic ovarian syndrome); Migraines; and Major depressive disorder, single episode, moderate with anxious distress (HCC) on their problem list..  She is having severe episodes of diarrhea x 45 days- has fatigue, weakness, diarrhea ,vomiting and nausea.  Denies blood and mucus in stool.  She brought stool cultures today for evaluation. She is getting intermittent abdominal pain and bloating . She can not get a GI appointment until 10/10/20 with [facility]. It will awaken her at night to have diarrhea.   She has swollen tonsils and was seen at Urgent care yesterday and is starting on an antibiotic.   Her blood pressure has been controlled at home, today their BP is BP: 140/86 BP Readings from Last 3 Encounters:  09/01/21 140/86  11/08/19 128/84  06/11/17 128/76    She does not workout. She denies chest pain, shortness of breath, dizziness.   She is not on cholesterol medication and denies myalgias. Her cholesterol is not at goal. The cholesterol last visit was:   Lab Results  Component Value Date   CHOL 197 12/09/2015   HDL 55 12/09/2015   LDLCALC 123 12/09/2015   TRIG 97 12/09/2015   CHOLHDL 3.6 12/09/2015   BMI is Body mass index is 44.29 kg/m., she has not been working on diet and exercise. In September 265 and has lost almost 40 pounds due to diarrhea and nausea Wt Readings from Last 3 Encounters:  09/01/21 226 lb 12.8 oz (102.9 kg)  11/08/19 232 lb 12.8 oz (105.6 kg)  06/11/17 197 lb (89.4 kg)     She has not been working on diet and exercise for abnormal glucose Lab Results  Component Value Date   HGBA1C 5.6 11/08/2019    Last GFR: Lab Results  Component Value Date   GFRNONAA 123 11/08/2019   Lab Results  Component Value Date   GFRAA 143 11/08/2019    Patient is on Vitamin D supplement.   Lab Results  Component Value Date   VD25OH 12 (L) 12/09/2015      Current  Medications:  Current Outpatient Medications on File Prior to Visit  Medication Sig Dispense Refill   metFORMIN (GLUCOPHAGE) 500 MG tablet Take by mouth 2 (two) times daily with a meal.     Norethin-Eth Estrad-Fe Biphas (LO LOESTRIN FE PO) Take by mouth.     benzonatate (TESSALON) 200 MG capsule Take 1 perle 3 x / day to prevent cough (Patient not taking: Reported on 09/01/2021) 30 capsule 1   dexamethasone (DECADRON) 4 MG tablet Take 1 tab 3 x day - 3 days, then 2 x day - 3 days, then 1 tab daily (Patient not taking: Reported on 09/01/2021) 20 tablet 0   norgestimate-ethinyl estradiol (ORTHO-CYCLEN) 0.25-35 MG-MCG tablet Take 1 tablet by mouth daily. (Patient not taking: Reported on 09/01/2021)     pseudoephedrine (SUDAFED) 120 MG 12 hr tablet Take  1 tablet  2 x /day (every 12 hours)  for Head and Chest Congestion (Patient not taking: Reported on 09/01/2021) 60 tablet 3   venlafaxine XR (EFFEXOR XR) 75 MG 24 hr capsule Take 1 cap by mouth daily for mood and migraines. (Patient not taking: Reported on 09/01/2021) 90 capsule 1   No current facility-administered medications on file prior to visit.   Allergies:  No Known Allergies Medical History:  She has Morbid obesity ([place]); Allergy; Panic attack; Iron deficiency anemia; Prediabetes; PCOS (polycystic ovarian syndrome); Migraines; and Major depressive disorder, single episode, moderate with anxious distress ([place]) on their problem list. Health Maintenance:   Immunization History  Administered Date(s) Administered   Td 07/11/2010    Tetanus: Pneumovax: Prevnar 13:  Flu vaccine: Zostavax: LMP: Pap: MGM:  DEXA: Colonoscopy: EGD:  Last Dental Exam: Last Eye Exam: Patient Care Team: Unk Pinto, MD as PCP - General (Internal Medicine)  Surgical History:  She has a past surgical history that includes abcess tooth (2010); wisdom teeth (2009); and Cholecystectomy (10/2011). Family History:  Herfamily history includes Ovarian  cancer in her maternal aunt and maternal grandmother. She was adopted. Social History:  She reports that she has never smoked. She has never used smokeless tobacco. She reports current alcohol use. She reports that she does not use drugs.  Review of Systems: Review of Systems  Constitutional:  Positive for malaise/fatigue and weight loss. Negative for chills and fever.  HENT:  Positive for sore throat. Negative for congestion, hearing loss, sinus pain and tinnitus.   Eyes:  Negative for blurred vision and double vision.  Respiratory:  Negative for cough, hemoptysis, sputum production, shortness of breath and wheezing.   Cardiovascular:  Negative for chest pain, palpitations and leg swelling.  Gastrointestinal:  Positive for diarrhea. Negative for abdominal pain, constipation, heartburn, nausea and vomiting.  Genitourinary:  Negative for dysuria and urgency.  Musculoskeletal:  Negative for back pain, falls, joint pain, myalgias and neck pain.  Skin:  Negative for rash.  Neurological:  Negative for dizziness, tingling, tremors, weakness and headaches.  Endo/Heme/Allergies:  Does not bruise/bleed easily.  Psychiatric/Behavioral:  Negative  for depression and suicidal ideas. The patient is not nervous/anxious and does not have insomnia.    Physical Exam: Estimated body mass index is 44.29 kg/m as calculated from the following:   Height as of this encounter: 5' (1.524 m).   Weight as of this encounter: 226 lb 12.8 oz (102.9 kg). BP 140/86   Pulse (!) 121   Temp 97.9 F (36.6 C)   Ht 5' (1.524 m)   Wt 226 lb 12.8 oz (102.9 kg)   LMP 08/20/2021   SpO2 97%   BMI 44.29 kg/m  General Appearance: Well nourished, in no apparent distress.  Eyes: PERRLA, EOMs, conjunctiva no swelling or erythema, normal fundi and vessels.  Sinuses: No Frontal/maxillary tenderness  ENT/Mouth: Ext aud canals clear, normal light reflex with TMs without erythema, bulging. Good dentition. Tonsils swollen with  erythema and white exudate. Hearing normal.  Neck: Supple, thyroid normal. No bruits  Respiratory: Respiratory effort normal, BS equal bilaterally without rales, rhonchi, wheezing or stridor.  Cardio: RRR without murmurs, rubs or gallops. Brisk peripheral pulses without edema.  Chest: symmetric, with normal excursions and percussion.  Breasts: Defer to GYN Abdomen: Positive bowel sounds all 4 quadrants, Soft, nontender, no guarding, rebound, hernias, masses, or organomegaly.  Lymphatics: Non tender without lymphadenopathy.  Genitourinary: defer to GYN Musculoskeletal: Full ROM all peripheral extremities,5/5 strength, and normal gait.  Skin: Warm, dry without rashes, lesions, ecchymosis. Neuro: Cranial nerves intact, reflexes equal bilaterally. Normal muscle tone, no cerebellar symptoms. Sensation intact.  Psych: Awake and oriented X 3, normal affect, Insight and Judgment appropriate.    Teresa Summers W Json Koelzer 2:23 PM [facility]

## 2021-08-29 ENCOUNTER — Other Ambulatory Visit: Payer: Self-pay

## 2021-08-29 ENCOUNTER — Other Ambulatory Visit (INDEPENDENT_AMBULATORY_CARE_PROVIDER_SITE_OTHER): Payer: 59

## 2021-09-01 ENCOUNTER — Ambulatory Visit (INDEPENDENT_AMBULATORY_CARE_PROVIDER_SITE_OTHER): Payer: 59 | Admitting: Nurse Practitioner

## 2021-09-01 ENCOUNTER — Encounter: Payer: Self-pay | Admitting: Nurse Practitioner

## 2021-09-01 ENCOUNTER — Other Ambulatory Visit: Payer: Self-pay

## 2021-09-01 VITALS — BP 140/86 | HR 121 | Temp 97.9°F | Ht 60.0 in | Wt 226.8 lb

## 2021-09-01 DIAGNOSIS — Z1389 Encounter for screening for other disorder: Secondary | ICD-10-CM

## 2021-09-01 DIAGNOSIS — G43009 Migraine without aura, not intractable, without status migrainosus: Secondary | ICD-10-CM

## 2021-09-01 DIAGNOSIS — Z0001 Encounter for general adult medical examination with abnormal findings: Secondary | ICD-10-CM

## 2021-09-01 DIAGNOSIS — F321 Major depressive disorder, single episode, moderate: Secondary | ICD-10-CM

## 2021-09-01 DIAGNOSIS — Z0000 Encounter for general adult medical examination without abnormal findings: Secondary | ICD-10-CM | POA: Diagnosis not present

## 2021-09-01 DIAGNOSIS — D509 Iron deficiency anemia, unspecified: Secondary | ICD-10-CM

## 2021-09-01 DIAGNOSIS — Z1329 Encounter for screening for other suspected endocrine disorder: Secondary | ICD-10-CM

## 2021-09-01 DIAGNOSIS — E559 Vitamin D deficiency, unspecified: Secondary | ICD-10-CM | POA: Insufficient documentation

## 2021-09-01 DIAGNOSIS — F410 Panic disorder [episodic paroxysmal anxiety] without agoraphobia: Secondary | ICD-10-CM

## 2021-09-01 DIAGNOSIS — E282 Polycystic ovarian syndrome: Secondary | ICD-10-CM

## 2021-09-01 DIAGNOSIS — J039 Acute tonsillitis, unspecified: Secondary | ICD-10-CM

## 2021-09-01 DIAGNOSIS — R197 Diarrhea, unspecified: Secondary | ICD-10-CM | POA: Insufficient documentation

## 2021-09-01 DIAGNOSIS — R7309 Other abnormal glucose: Secondary | ICD-10-CM

## 2021-09-01 DIAGNOSIS — Z79899 Other long term (current) drug therapy: Secondary | ICD-10-CM

## 2021-09-01 DIAGNOSIS — Z13220 Encounter for screening for lipoid disorders: Secondary | ICD-10-CM

## 2021-09-02 ENCOUNTER — Ambulatory Visit: Payer: 59 | Admitting: Adult Health

## 2021-09-02 ENCOUNTER — Encounter: Payer: Self-pay | Admitting: Nurse Practitioner

## 2021-09-02 LAB — URINALYSIS, ROUTINE W REFLEX MICROSCOPIC
Bilirubin Urine: NEGATIVE
Glucose, UA: NEGATIVE
Leukocytes,Ua: NEGATIVE
Nitrite: NEGATIVE
Specific Gravity, Urine: 1.031 (ref 1.001–1.035)
pH: 6.5 (ref 5.0–8.0)

## 2021-09-02 LAB — COMPLETE METABOLIC PANEL WITH GFR
AG Ratio: 1.1 (calc) (ref 1.0–2.5)
ALT: 36 U/L — ABNORMAL HIGH (ref 6–29)
AST: 21 U/L (ref 10–30)
Albumin: 4 g/dL (ref 3.6–5.1)
Alkaline phosphatase (APISO): 93 U/L (ref 31–125)
BUN/Creatinine Ratio: 10 (calc) (ref 6–22)
BUN: 6 mg/dL — ABNORMAL LOW (ref 7–25)
CO2: 22 mmol/L (ref 20–32)
Calcium: 9.5 mg/dL (ref 8.6–10.2)
Chloride: 98 mmol/L (ref 98–110)
Creat: 0.6 mg/dL (ref 0.50–0.96)
Globulin: 3.8 g/dL (calc) — ABNORMAL HIGH (ref 1.9–3.7)
Glucose, Bld: 73 mg/dL (ref 65–99)
Potassium: 3.4 mmol/L — ABNORMAL LOW (ref 3.5–5.3)
Sodium: 135 mmol/L (ref 135–146)
Total Bilirubin: 0.4 mg/dL (ref 0.2–1.2)
Total Protein: 7.8 g/dL (ref 6.1–8.1)
eGFR: 125 mL/min/{1.73_m2} (ref >=60)

## 2021-09-02 LAB — LIPID PANEL
Cholesterol: 183 mg/dL (ref <200)
HDL: 32 mg/dL — ABNORMAL LOW (ref >=50)
LDL Cholesterol (Calc): 122 mg/dL (calc) — ABNORMAL HIGH
Non-HDL Cholesterol (Calc): 151 mg/dL (calc) — ABNORMAL HIGH (ref <130)
Total CHOL/HDL Ratio: 5.7 (calc) — ABNORMAL HIGH (ref <5.0)
Triglycerides: 176 mg/dL — ABNORMAL HIGH (ref <150)

## 2021-09-02 LAB — CBC WITH DIFFERENTIAL/PLATELET
Absolute Monocytes: 1170 cells/uL — ABNORMAL HIGH (ref 200–950)
Basophils Absolute: 47 cells/uL (ref 0–200)
Basophils Relative: 0.4 %
Eosinophils Absolute: 655 cells/uL — ABNORMAL HIGH (ref 15–500)
Eosinophils Relative: 5.6 %
HCT: 37.5 % (ref 35.0–45.0)
Hemoglobin: 12.5 g/dL (ref 11.7–15.5)
Lymphs Abs: 2843 cells/uL (ref 850–3900)
MCH: 26.4 pg — ABNORMAL LOW (ref 27.0–33.0)
MCHC: 33.3 g/dL (ref 32.0–36.0)
MCV: 79.1 fL — ABNORMAL LOW (ref 80.0–100.0)
MPV: 9.6 fL (ref 7.5–12.5)
Monocytes Relative: 10 %
Neutro Abs: 6985 cells/uL (ref 1500–7800)
Neutrophils Relative %: 59.7 %
Platelets: 361 10*3/uL (ref 140–400)
RBC: 4.74 10*6/uL (ref 3.80–5.10)
RDW: 14.9 % (ref 11.0–15.0)
Total Lymphocyte: 24.3 %
WBC: 11.7 10*3/uL — ABNORMAL HIGH (ref 3.8–10.8)

## 2021-09-02 LAB — MICROALBUMIN / CREATININE URINE RATIO
Creatinine, Urine: 356 mg/dL — ABNORMAL HIGH (ref 20–275)
Microalb Creat Ratio: 128 mcg/mg creat — ABNORMAL HIGH (ref <30)
Microalb, Ur: 45.4 mg/dL

## 2021-09-02 LAB — HEMOGLOBIN A1C
Hgb A1c MFr Bld: 5.4 % of total Hgb (ref <5.7)
Mean Plasma Glucose: 108 mg/dL
eAG (mmol/L): 6 mmol/L

## 2021-09-02 LAB — MAGNESIUM: Magnesium: 1.9 mg/dL (ref 1.5–2.5)

## 2021-09-02 LAB — VITAMIN D 25 HYDROXY (VIT D DEFICIENCY, FRACTURES): Vit D, 25-Hydroxy: 11 ng/mL — ABNORMAL LOW (ref 30–100)

## 2021-09-02 LAB — TSH: TSH: 1.45 mIU/L

## 2021-09-02 LAB — MICROSCOPIC MESSAGE

## 2021-09-08 LAB — GASTROINTESTINAL PATHOGEN PANEL PCR
C. difficile Tox A/B, PCR: NOT DETECTED
Campylobacter, PCR: NOT DETECTED
Cryptosporidium, PCR: NOT DETECTED
E coli (ETEC) LT/ST PCR: NOT DETECTED
E coli (STEC) stx1/stx2, PCR: NOT DETECTED
E coli 0157, PCR: NOT DETECTED
Giardia lamblia, PCR: NOT DETECTED
Norovirus, PCR: NOT DETECTED
Rotavirus A, PCR: NOT DETECTED
Salmonella, PCR: NOT DETECTED
Shigella, PCR: NOT DETECTED

## 2021-09-11 LAB — OVA AND PARASITE EXAMINATION
CONCENTRATE RESULT:: NONE SEEN
MICRO NUMBER:: 12768509
SPECIMEN QUALITY:: ADEQUATE
TRICHROME RESULT:: NONE SEEN

## 2021-11-12 NOTE — Progress Notes (Signed)
Hospital follow up  Assessment and Plan: Hospital visit follow up for Pulmonary embolism:   Teresa Summers was seen today for hospitalization follow-up.  Diagnoses and all orders for this visit:  Pulmonary embolism on right ([place]) Continue on Xarelto If develop shortness of breath, increased chest pain or O2 sat <92% she is to go to the ER Follow up in 3 months or PRN if symptoms worsen -     COMPLETE METABOLIC PANEL WITH GFR  PCOS (polycystic ovarian syndrome)/ Dysfunctional uterine bleeding Follow up with OB/GYN today -     CBC with Differential/Platelet -     Ferritin -     Iron, Total/Total Iron Binding Cap  Palpitations Continue to monitor Sinus tachycardia has been persistent x several years Go to the ER if any chest pain, shortness of breath, nausea, dizziness, severe HA, changes vision/speech -     EKG 12-Lead     All medications were reviewed with patient and family and fully reconciled. All questions answered fully, and patient and family members were encouraged to call the office with any further questions or concerns. Discussed goal to avoid readmission related to this diagnosis.  There are no discontinued medications.  CAN NOT DO FOR BCBS REGULAR OR MEDICARE Over 40 minutes of exam, counseling, chart review, and complex, high/moderate level critical decision making was performed this visit.   Future Appointments  Date Time Provider [place]  09/09/2022 10:00 AM Magda Bernheim, NP [facility] None     HPI 31 y.o.female presents for follow up for transition from recent hospitalization or [facility] stay. Admit date to the hospital was 10/30/21, patient was discharged from the hospital on 11/01/21 and our clinical staff contacted the office the day after discharge to set up a follow up appointment. The discharge summary, medications, and diagnostic test results were reviewed before meeting with the patient. The patient was admitted for:   Teresa Summers is a 31 y.o.  female with pmhx of asthma, anemia, HLD, and PCOS [on OCPs and metformin] who presented to ED for chest pain. Patient underwent CTA which showed possible findings comptaible with acute pulmonary emboli within a lingular segmental artery with possible additional emboli seen in the R upper and L lower lobes though evaluation limited 2/2 bolus timing. She was started on Xarelto in the ED and was switched to Lovenox. Cardiology was consulted in the ED, no intervention was needed. It was recommended she start a DOAC with plans to complete 3-6 months of anticoagulation. Throughout the stay patient did not require oxygen. She was noted to have an anion gap metabolic acidosis on admissions labs. This resolved without intervention during course of hospital stay. ABG collected did not show metabolic acidosis and lactic acid was normal. AM cortisol was obtained with c/f adrenal insufficiency; 1 AM cortisol was collected showing level of 2.6. An endocrinology referral was placed at discharge for further evaluation of possible adrenal insufficiency. Urine studies were indicative of positive urine anion gap which may reflect RTA. Urine dipstick was positive for ketones during admission. On the day of discharge, patient's vital signs remained stable and she was switched to Xarelto at discharge. Discussed co-pay for this medication and patient was in agreement with this. She will be discharged to complete a 3 month supply. H'@H'  will follow patient and repeat CMP to ensure resolution of metabolic acidosis. Her estrogen containing OCP was discontinued at discharge and she was instructed to follow up with her OBGYN for alternative options.   For the details  of admission, please see the H&P on 10/30/2021. For full details, please see progress notes, consult notes and ancillary notes. The patient's hospital course will be summarized in a problem based approach below.   Acute R Pulmonary Embolism likely 2/2 estrogen containing OCPs,  active  Tachycardic, no new O2 requirement, received xarelto in ED Troponin: 23>4 CTA: acute pulmonary emboli within a lingular segmental artery. Possible additional emboli are seen in the right upper and left lower lobes, though evaluation is limited due to contrast bolus timing. TTE: borderline RV dilation and normal function - cardiology consulted in ED- no intervention needed. Recommended 3-6 months of AC for provoked PE - Received Lovenox while inpatient - pharmacy price checked xarelto/eliquis loading dose and maintenance dose would be in $400/$200-$300 monthly. Discussed this with patient who is agreeable to paying for Xarelto -She will start Xarelto at discharge; plan for twice daily dosing for 21 days then switch to once daily dosing. Complete 3 months of therapy. - initiated on lovenox treatment dose - discontinue OCPs; discussed this with patient. She will follow up with OBGYN.  Anion Gap Metabolic Acidosis, resolved  Anion gap 19>11 CO2 13 Lactic acid WNL. Urine studies suggestive of urine anion gap Glucose was not elevated, lower suspicion for DKA. Patient with emesis x1 PTA- starvation ketoacidosis may have played a role - H'@H'  to repeat CMP to ensure resolution of met acidosis  Low AM Cortisol Concern for adrenal insufficiency 1 AM cortisol level --> 2.6. -GC placed for endocrinology follow up  Leukocytosis, improving  WBC: 19.8>11.7 Procal negative - low suspicion for infection; felt reactive  PCOS -continue metformin at dc - discontinue OCPs and follow-up with primary/obgyn for alternative non-estrogen containing options     The patient's chronic medical conditions were treated accordingly per the patient's home medication regimen except as noted in the plan above and in the medication list below   Home health is not involved.   Images while in the hospital: CT ABDOMEN PELVIS W CONTRAST  Result Date: 12/05/2016 CLINICAL DATA:  Hervey Ard right lower quadrant pain and  vomiting. EXAM: CT ABDOMEN AND PELVIS WITH CONTRAST TECHNIQUE: Multidetector CT imaging of the abdomen and pelvis was performed using the standard protocol following bolus administration of intravenous contrast. CONTRAST:  100 mL Isovue-300 COMPARISON:  None. FINDINGS: Lower chest: No pulmonary nodules. No visible pleural or pericardial effusion. Hepatobiliary: Normal hepatic size and contours without focal liver lesion. No perihepatic ascites. No intra- or extrahepatic biliary dilatation. Gallbladder surgically absent. Pancreas: Normal pancreatic contours and enhancement. No peripancreatic fluid collection or pancreatic ductal dilatation. Spleen: Normal. Adrenals/Urinary Tract: Normal adrenal glands. No hydronephrosis or solid renal mass. Stomach/Bowel: No abnormal bowel dilatation. No bowel wall thickening or adjacent fat stranding to indicate acute inflammation. No abdominal fluid collection. Normal appendix. Vascular/Lymphatic: Normal course and caliber of the major abdominal vessels. No abdominal or pelvic lymphadenopathy. Reproductive: There is fluid within the endometrial cavity. There is a peripherally enhancing low-density focus within the right ovary. Left ovary is normal. No free fluid in the pelvis. Musculoskeletal: No lytic or blastic osseous lesion. Normal visualized extrathoracic and extraperitoneal soft tissues. Other: No contributory non-categorized findings. IMPRESSION: 1. Normal appendix. 2. Peripherally enhancing cystic focus of the right ovary, possibly corpus luteum cyst or hemorrhagic cyst. No free fluid in the pelvis. Pelvic ultrasound could be considered for further evaluation, if clinically indicated. Electronically Signed   By: Ulyses Jarred M.D.   On: 12/05/2016 05:20   She continues to have a tight  feeling in chest since the bilateral PE. Hospital did advise she can have these sensations while blood clots resolve.  She does have occasional shortness of breath and does feel like her  heart is skipping a beat occasionally.   Her blood pressure is currently controlled without medication BP Readings from Last 3 Encounters:  11/13/21 122/88  09/01/21 140/86  11/08/19 128/84    Persistent tachycardia is noted Pulse Readings from Last 3 Encounters:  11/13/21 (!) 116  09/01/21 (!) 121  11/08/19 (!) 108     BMI is Body mass index is 41.79 kg/m., she has been working on diet and exercise. Wt Readings from Last 3 Encounters:  11/13/21 214 lb (97.1 kg)  09/01/21 226 lb 12.8 oz (102.9 kg)  11/08/19 232 lb 12.8 oz (105.6 kg)   She is currently on Metformin for PCOS . Was taken off OCP's due to PE. She has been having vaginal bleeding since stopping her birth control.  Bleeding is heavy and has been present since 11/02/21, changes a pad every 1-2 hours.  Lab Results  Component Value Date   WBC 11.7 (H) 09/01/2021   HGB 12.5 09/01/2021   HCT 37.5 09/01/2021   MCV 79.1 (L) 09/01/2021   PLT 361 09/01/2021      Current Outpatient Medications (Endocrine & Metabolic):    metFORMIN (GLUCOPHAGE) 500 MG tablet, Take by mouth 2 (two) times daily with a meal.   dexamethasone (DECADRON) 4 MG tablet, Take 1 tab 3 x day - 3 days, then 2 x day - 3 days, then 1 tab daily (Patient not taking: Reported on 09/01/2021)   Norethin-Eth Estrad-Fe Biphas (LO LOESTRIN FE PO), Take by mouth. (Patient not taking: Reported on 11/13/2021)   norgestimate-ethinyl estradiol (ORTHO-CYCLEN) 0.25-35 MG-MCG tablet, Take 1 tablet by mouth daily. (Patient not taking: Reported on 09/01/2021)   Current Outpatient Medications (Respiratory):    benzonatate (TESSALON) 200 MG capsule, Take 1 perle 3 x / day to prevent cough (Patient not taking: Reported on 09/01/2021)   pseudoephedrine (SUDAFED) 120 MG 12 hr tablet, Take  1 tablet  2 x /day (every 12 hours)  for Head and Chest Congestion (Patient not taking: Reported on 09/01/2021)   Current Outpatient Medications (Hematological):    Rivaroxaban (XARELTO) 15  MG TABS tablet, Take 15 mg by mouth 2 (two) times daily with a meal.  Current Outpatient Medications (Other):    venlafaxine XR (EFFEXOR XR) 75 MG 24 hr capsule, Take 1 cap by mouth daily for mood and migraines. (Patient not taking: Reported on 09/01/2021)  Past Medical History:  Diagnosis Date   Allergy    Asthma    Obesity      No Known Allergies  ROS: all negative except above.   Physical Exam: Filed Weights   11/13/21 1129  Weight: 214 lb (97.1 kg)   BP 122/88    Pulse (!) 116    Temp 97.7 F (36.5 C)    Wt 214 lb (97.1 kg)    LMP 11/13/2021    SpO2 98%    BMI 41.79 kg/m  General Appearance: Well nourished, in no apparent distress. Eyes: PERRLA, EOMs, conjunctiva no swelling or erythema Sinuses: No Frontal/maxillary tenderness ENT/Mouth: Ext aud canals clear, TMs without erythema, bulging. No erythema, swelling, or exudate on post pharynx.  Tonsils not swollen or erythematous. Hearing normal.  Neck: Supple, thyroid normal.  Respiratory: Respiratory effort normal, BS equal bilaterally without rales, rhonchi, wheezing or stridor.  Cardio: Tachycardia with regular rhythm, no  MRGs. Brisk peripheral pulses without edema.  Abdomen: Soft, + BS.  Non tender, no guarding, rebound, hernias, masses. Lymphatics: Non tender without lymphadenopathy.  Musculoskeletal: Full ROM, 5/5 strength, normal gait.  Skin: Warm, dry without rashes, lesions, ecchymosis.  Neuro: Cranial nerves intact. Normal muscle tone, no cerebellar symptoms. Sensation intact.  Psych: Awake and oriented X 3, normal affect, Insight and Judgment appropriate.  EKG: Sinus tachycardia, no ST changes  Keenan Dimitrov Kathyrn Drown, NP 11:40 AM [facility]

## 2021-11-13 ENCOUNTER — Encounter: Payer: Self-pay | Admitting: Nurse Practitioner

## 2021-11-13 ENCOUNTER — Ambulatory Visit (INDEPENDENT_AMBULATORY_CARE_PROVIDER_SITE_OTHER): Payer: 59 | Admitting: Nurse Practitioner

## 2021-11-13 ENCOUNTER — Other Ambulatory Visit: Payer: Self-pay

## 2021-11-13 VITALS — BP 122/88 | HR 116 | Temp 97.7°F | Wt 214.0 lb

## 2021-11-13 DIAGNOSIS — N938 Other specified abnormal uterine and vaginal bleeding: Secondary | ICD-10-CM | POA: Diagnosis not present

## 2021-11-13 DIAGNOSIS — R002 Palpitations: Secondary | ICD-10-CM

## 2021-11-13 DIAGNOSIS — E282 Polycystic ovarian syndrome: Secondary | ICD-10-CM

## 2021-11-13 DIAGNOSIS — I2699 Other pulmonary embolism without acute cor pulmonale: Secondary | ICD-10-CM | POA: Diagnosis not present

## 2021-11-13 LAB — COMPLETE METABOLIC PANEL WITH GFR
AG Ratio: 1.4 (calc) (ref 1.0–2.5)
ALT: 17 U/L (ref 6–29)
AST: 14 U/L (ref 10–30)
Albumin: 3.9 g/dL (ref 3.6–5.1)
Alkaline phosphatase (APISO): 90 U/L (ref 31–125)
BUN: 9 mg/dL (ref 7–25)
CO2: 24 mmol/L (ref 20–32)
Calcium: 9.6 mg/dL (ref 8.6–10.2)
Chloride: 105 mmol/L (ref 98–110)
Creat: 0.54 mg/dL (ref 0.50–0.96)
Globulin: 2.7 g/dL (calc) (ref 1.9–3.7)
Glucose, Bld: 93 mg/dL (ref 65–99)
Potassium: 3.8 mmol/L (ref 3.5–5.3)
Sodium: 138 mmol/L (ref 135–146)
Total Bilirubin: 0.3 mg/dL (ref 0.2–1.2)
Total Protein: 6.6 g/dL (ref 6.1–8.1)
eGFR: 129 mL/min/{1.73_m2} (ref >=60)

## 2021-11-13 LAB — FERRITIN: Ferritin: 4 ng/mL — ABNORMAL LOW (ref 16–154)

## 2021-11-13 LAB — CBC WITH DIFFERENTIAL/PLATELET
Absolute Monocytes: 1069 cells/uL — ABNORMAL HIGH (ref 200–950)
Basophils Absolute: 66 cells/uL (ref 0–200)
Basophils Relative: 0.5 %
Eosinophils Absolute: 132 cells/uL (ref 15–500)
Eosinophils Relative: 1 %
HCT: 29.4 % — ABNORMAL LOW (ref 35.0–45.0)
Hemoglobin: 9.6 g/dL — ABNORMAL LOW (ref 11.7–15.5)
Lymphs Abs: 2838 cells/uL (ref 850–3900)
MCH: 26.3 pg — ABNORMAL LOW (ref 27.0–33.0)
MCHC: 32.7 g/dL (ref 32.0–36.0)
MCV: 80.5 fL (ref 80.0–100.0)
MPV: 8.9 fL (ref 7.5–12.5)
Monocytes Relative: 8.1 %
Neutro Abs: 9095 cells/uL — ABNORMAL HIGH (ref 1500–7800)
Neutrophils Relative %: 68.9 %
Platelets: 429 10*3/uL — ABNORMAL HIGH (ref 140–400)
RBC: 3.65 10*6/uL — ABNORMAL LOW (ref 3.80–5.10)
RDW: 13.8 % (ref 11.0–15.0)
Total Lymphocyte: 21.5 %
WBC: 13.2 10*3/uL — ABNORMAL HIGH (ref 3.8–10.8)

## 2021-11-13 LAB — IRON, TOTAL/TOTAL IRON BINDING CAP
%SAT: 9 % (calc) — ABNORMAL LOW (ref 16–45)
Iron: 31 ug/dL — ABNORMAL LOW (ref 40–190)
TIBC: 352 mcg/dL (calc) (ref 250–450)

## 2021-11-13 NOTE — Patient Instructions (Signed)
Rivaroxaban Tablets What is this medication? RIVAROXABAN (ri va ROX a ban) prevents or treats blood clots. It is also used to lower the risk of stroke in people with AFib (atrial fibrillation). It can be used to lower the risk of heart attack or stroke in people with heart or peripheral artery disease. It belongs to a group of medications called blood thinners. This medicine may be used for other purposes; ask your health care provider or pharmacist if you have questions. COMMON BRAND NAME(S): Xarelto, Xarelto Starter Pack What should I tell my care team before I take this medication? They need to know if you have any of these conditions: Antiphospholipid antibody syndrome Bleeding disorders Bleeding in the brain Blood clots Kidney disease Liver disease Prosthetic heart valve Recent or planned spinal or epidural procedure Stomach bleeding Take medications that treat or prevent blood clots An unusual or allergic reaction to rivaroxaban, other medications, foods, dyes, or preservatives Pregnant or trying to get pregnant Breast-feeding How should I use this medication? Take this medication by mouth. For your therapy to work as well as possible, take each dose exactly as prescribed on the prescription label. Do not skip doses. Skipping doses or stopping this medication can increase your risk of a blood clot or stroke. Keep taking this medication unless your care team tells you to stop. If you are taking this medication after hip or knee replacement surgery, take it with or without food. If you are taking this medication for atrial fibrillation, take it with your evening meal. If you are taking this medication to treat blood clots, take it with food at the same time each day. If you are taking this medication for coronary artery disease or peripheral artery disease, take it with or without food at the same time every day. If you are unable to swallow your tablet, you may crush the tablet and mix it  in applesauce. Then, immediately eat the applesauce. You should eat more food right after you eat the applesauce containing the crushed tablet. A special MedGuide will be given to you by the pharmacist with each prescription and refill. Be sure to read this information carefully each time. Talk to your care team about the use of this medication in children. While it may be prescribed for children as young as newborns for selected conditions, precautions do apply. Overdosage: If you think you have taken too much of this medicine contact a poison control center or emergency room at once. NOTE: This medicine is only for you. Do not share this medicine with others. What if I miss a dose? If you take your medication once a day and miss a dose, take it as soon as you can. If it is almost time for your next dose, take only that dose. Do not take double or extra doses. If you are taking this medication twice a day to treat blood clots and miss a dose, take the missed dose as soon as you remember. In this instance, 2 tablets may be taken at the same time. The next day you should take 1 tablet twice a day. If you are taking this medication twice a day for coronary artery disease or peripheral artery disease and miss a dose, skip it. Take your next dose at the normal time. Do not take extra or 2 doses at the same time to make up for the missed dose. What may interact with this medication? Do not take this medication with any of the following: Defibrotide This   medication may also interact with the following: Aspirin and aspirin-like medications Certain antibiotics like erythromycin and clarithromycin Certain medications for fungal infections like ketoconazole and itraconazole Certain medications for seizures like carbamazepine, phenytoin Certain medications that treat or prevent blood clots like warfarin, enoxaparin, dalteparin, apixaban, dabigatran, and edoxaban Conivaptan Indinavir Lopinavir;  ritonavir NSAIDS, medications for pain and inflammation, like ibuprofen or naproxen Rifampin Ritonavir SNRIs, medications for depression, like desvenlafaxine, duloxetine, levomilnacipran, venlafaxine SSRIs, medications for depression, like citalopram, escitalopram, fluoxetine, fluvoxamine, paroxetine, sertraline St. John's wort This list may not describe all possible interactions. Give your health care provider a list of all the medicines, herbs, non-prescription drugs, or dietary supplements you use. Also tell them if you smoke, drink alcohol, or use illegal drugs. Some items may interact with your medicine. What should I watch for while using this medication? Visit your care team for regular checks on your progress. You may need blood work done while you are taking this medication. Your condition will be monitored carefully while you are receiving this medication. It is important not to miss any appointments. Avoid sports and activities that might cause injury while you are using this medication. Severe falls or injuries can cause unseen bleeding. Be careful when using sharp tools or knives. Consider using an electric razor. Take special care brushing or flossing your teeth. Report any injuries, bruising, or red spots on the skin to your care team. Wear a medical ID bracelet or chain. Carry a card that describes your condition. List the medications and doses you take on the card. Tell your dentist and dental surgeon that you are taking this medication. You should not have major dental surgery while on this medication. See your dentist to have a dental exam and fix any dental problems before starting this medication. Take good care of your teeth while on this medication. Make sure you see your dentist for regular follow-up appointments. If you are going to need surgery or other procedure, tell your care team that you are using this medication. Do not become pregnant while taking this medication. Women  should inform their care team if they wish to become pregnant or think they might be pregnant. There is potential for serious harm to an unborn child. Talk to your care team for more information. What side effects may I notice from receiving this medication? Side effects that you should report to your care team as soon as possible: Allergic reactions--skin rash, itching, hives, swelling of the face, lips, tongue, or throat Bleeding--bloody or black, tar-like stools, vomiting blood or brown material that looks like coffee grounds, red or dark brown urine, small red or purple spots on skin, unusual bruising or bleeding Bleeding in the brain--severe headache, stiff neck, confusion, dizziness, change in vision, numbness or weakness of the face, arm, or leg, trouble speaking, trouble walking, vomiting Heavy periods This list may not describe all possible side effects. Call your doctor for medical advice about side effects. You may report side effects to FDA at 1-800-FDA-1088. Where should I keep my medication? Keep out of the reach of children and pets. Store at room temperature between 20 and 25 degrees C (68 and 77 degrees F). Get rid of any unused medication after the expiration date. To get rid of medications that are no longer needed or have expired: Take the medication to a medication take-back program. Check your pharmacy or law enforcement to find a location. If you cannot return the medication, check the label or package insert to see   if the medication should be thrown out in the garbage or flushed down the toilet. If you are not sure, ask your care team. If it is safe to put it in the trash, empty the medication out of the container. Mix the medication with cat litter, dirt, coffee grounds, or other unwanted substance. Seal the mixture in a bag or container. Put it in the trash. NOTE: This sheet is a summary. It may not cover all possible information. If you have questions about this medicine,  talk to your doctor, pharmacist, or health care provider.  2022 Elsevier/Gold Standard (2020-11-01 00:00:00)  

## 2021-11-14 ENCOUNTER — Encounter: Payer: Self-pay | Admitting: Nurse Practitioner

## 2021-11-14 ENCOUNTER — Other Ambulatory Visit: Payer: Self-pay | Admitting: Nurse Practitioner

## 2021-11-23 ENCOUNTER — Encounter: Payer: Self-pay | Admitting: Nurse Practitioner

## 2021-11-25 ENCOUNTER — Other Ambulatory Visit: Payer: Self-pay | Admitting: Nurse Practitioner

## 2021-11-25 DIAGNOSIS — I2699 Other pulmonary embolism without acute cor pulmonale: Secondary | ICD-10-CM

## 2021-11-25 MED ORDER — RIVAROXABAN 15 MG PO TABS: 15.0000 mg | ORAL_TABLET | Freq: Every day | ORAL | 3 refills | Status: DC

## 2021-11-25 MED ORDER — RIVAROXABAN 15 MG PO TABS: 15.0000 mg | ORAL_TABLET | Freq: Two times a day (BID) | ORAL | 1 refills | Status: DC

## 2021-12-01 NOTE — Progress Notes (Unsigned)
Assessment and Plan:  There are no diagnoses linked to this encounter.    Further disposition pending results of labs. Discussed med's effects and SE's.   Over 30 minutes of exam, counseling, chart review, and critical decision making was performed.   Future Appointments  Date Time Provider [place]  12/02/2021  4:00 PM Magda Bernheim, NP [facility] None  02/19/2022  3:30 PM Magda Bernheim, NP [facility] None  09/09/2022 10:00 AM Magda Bernheim, NP [facility] None    ------------------------------------------------------------------------------------------------------------------   HPI LMP 11/13/2021  31 y.o.female presents for  Past Medical History:  Diagnosis Date   Allergy    Asthma    Obesity      No Known Allergies  Current Outpatient Medications on File Prior to Visit  Medication Sig   metFORMIN (GLUCOPHAGE) 500 MG tablet Take by mouth 2 (two) times daily with a meal.   Rivaroxaban (XARELTO) 15 MG TABS tablet Take 1 tablet (15 mg total) by mouth daily with supper.   No current facility-administered medications on file prior to visit.    ROS: all negative except above.   Physical Exam:  LMP 11/13/2021   General Appearance: Well nourished, in no apparent distress. Eyes: PERRLA, EOMs, conjunctiva no swelling or erythema Sinuses: No Frontal/maxillary tenderness ENT/Mouth: Ext aud canals clear, TMs without erythema, bulging. No erythema, swelling, or exudate on post pharynx.  Tonsils not swollen or erythematous. Hearing normal.  Neck: Supple, thyroid normal.  Respiratory: Respiratory effort normal, BS equal bilaterally without rales, rhonchi, wheezing or stridor.  Cardio: RRR with no MRGs. Brisk peripheral pulses without edema.  Abdomen: Soft, + BS.  Non tender, no guarding, rebound, hernias, masses. Lymphatics: Non tender without lymphadenopathy.  Musculoskeletal: Full ROM, 5/5 strength, normal gait.  Skin: Warm, dry without rashes, lesions, ecchymosis.   Neuro: Cranial nerves intact. Normal muscle tone, no cerebellar symptoms. Sensation intact.  Psych: Awake and oriented X 3, normal affect, Insight and Judgment appropriate.     Magda Bernheim, NP 1:53 PM [facility]

## 2021-12-02 ENCOUNTER — Ambulatory Visit (INDEPENDENT_AMBULATORY_CARE_PROVIDER_SITE_OTHER): Payer: 59 | Admitting: Nurse Practitioner

## 2021-12-02 ENCOUNTER — Encounter: Payer: Self-pay | Admitting: Nurse Practitioner

## 2021-12-02 ENCOUNTER — Other Ambulatory Visit: Payer: Self-pay

## 2021-12-02 VITALS — BP 124/78 | HR 105 | Temp 97.7°F | Wt 215.0 lb

## 2021-12-02 DIAGNOSIS — I2699 Other pulmonary embolism without acute cor pulmonale: Secondary | ICD-10-CM | POA: Diagnosis not present

## 2021-12-02 DIAGNOSIS — D508 Other iron deficiency anemias: Secondary | ICD-10-CM | POA: Diagnosis not present

## 2021-12-02 DIAGNOSIS — Z7901 Long term (current) use of anticoagulants: Secondary | ICD-10-CM | POA: Diagnosis not present

## 2021-12-03 LAB — CBC WITH DIFFERENTIAL/PLATELET
Absolute Monocytes: 850 cells/uL (ref 200–950)
Basophils Absolute: 68 cells/uL (ref 0–200)
Basophils Relative: 0.4 %
Eosinophils Absolute: 136 cells/uL (ref 15–500)
Eosinophils Relative: 0.8 %
HCT: 31.4 % — ABNORMAL LOW (ref 35.0–45.0)
Hemoglobin: 9.8 g/dL — ABNORMAL LOW (ref 11.7–15.5)
Lymphs Abs: 4420 cells/uL — ABNORMAL HIGH (ref 850–3900)
MCH: 24.9 pg — ABNORMAL LOW (ref 27.0–33.0)
MCHC: 31.2 g/dL — ABNORMAL LOW (ref 32.0–36.0)
MCV: 79.7 fL — ABNORMAL LOW (ref 80.0–100.0)
MPV: 8.5 fL (ref 7.5–12.5)
Monocytes Relative: 5 %
Neutro Abs: 11526 cells/uL — ABNORMAL HIGH (ref 1500–7800)
Neutrophils Relative %: 67.8 %
Platelets: 576 10*3/uL — ABNORMAL HIGH (ref 140–400)
RBC: 3.94 10*6/uL (ref 3.80–5.10)
RDW: 13.1 % (ref 11.0–15.0)
Total Lymphocyte: 26 %
WBC: 17 10*3/uL — ABNORMAL HIGH (ref 3.8–10.8)

## 2021-12-03 LAB — IRON, TOTAL/TOTAL IRON BINDING CAP
%SAT: 5 % (calc) — ABNORMAL LOW (ref 16–45)
Iron: 21 ug/dL — ABNORMAL LOW (ref 40–190)
TIBC: 395 mcg/dL (calc) (ref 250–450)

## 2021-12-12 ENCOUNTER — Encounter: Payer: Self-pay | Admitting: Nurse Practitioner

## 2021-12-15 ENCOUNTER — Telehealth: Payer: Self-pay | Admitting: Nurse Practitioner

## 2021-12-15 NOTE — Telephone Encounter (Signed)
Have her follow up with me in 2 weeks

## 2021-12-15 NOTE — Telephone Encounter (Signed)
Patient states that she went to ER over the weekend due to chest pain. Everything was okay but they said that her iron and hemoglobin were low and recommended that we do follow up labs to re-check her levels. Does she need another office visit with you or just a lab only and when do you want her to be scheduled? -e welch ?

## 2021-12-19 ENCOUNTER — Encounter: Payer: Self-pay | Admitting: Nurse Practitioner

## 2021-12-31 NOTE — Progress Notes (Signed)
Assessment and Plan: ? ?Teresa Summers was seen today for follow-up. ? ?Diagnoses and all orders for this visit: ? ?Pulmonary embolism on right [facility]) ?On continuous oral anticoagulation ?She will have 1 more month of Xarelto and then can come off as she will have completed the 3 months course ?Follow up scheduled 02/19/22 ? ?Other iron deficiency anemia ?Continue Fusion Plus, if H/H is still less than 10 may increase to twice a day ?-     CBC with Differential/Platelet ?-     Iron, Total/Total Iron Binding Cap ?-     Ferritin ? ?  ? ? ?Further disposition pending results of labs. Discussed med's effects and SE's.   ?Over 30 minutes of exam, counseling, chart review, and critical decision making was performed.  ? ?Future Appointments  ?Date Time Provider Brielle  ?01/01/2022  3:30 PM Omar Orrego, Townsend Roger, NP [facility] None  ?02/19/2022  3:30 PM Magda Bernheim, NP [facility] None  ?09/09/2022 10:00 AM Magda Bernheim, NP [facility] None  ? ? ?------------------------------------------------------------------------------------------------------------------ ? ? ?HPI ?BP 138/88   Pulse 97   Temp 97.7 ?F (36.5 ?C)   Wt 215 lb (97.5 kg)   SpO2 98%   BMI 41.99 kg/m?  ? ?31 y.o.female presents for reevaluation of CBC .  She has been on Xarelto for PE and developed very lengthy  menstrual cycle and anemia.  Have been following since then . She was reevaluated in the ER on 12/12/21- CTA revealed ?1.  Decreased conspicuity of the previously described filling defects. No evidence of new filling defects.  ?2.  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 This is often associated with sinus venosus ASD and cardiac echocardiography or cardiac MR may be of value.  ?3.  No acute pulmonary abnormality. ? ?Had echo 10/31/21 which showed: ?Pulmonary venous flow pattern is normal. The IVC is normal in size  ?with an inspiratory collapse of greater then 50%, suggesting normal  ?right atrial  pressure.  ? ?She has had another period 12/18/21 that lasted 12 days.  Bleeding was still heavy but not as heavy as the period prior.  ? ?She continues on Fusion Plus daily. Last CBC: ?Lab Results  ?Component Value Date  ? WBC 17.0 (H) 12/02/2021  ? HGB 9.8 (L) 12/02/2021  ? HCT 31.4 (L) 12/02/2021  ? MCV 79.7 (L) 12/02/2021  ? PLT 576 (H) 12/02/2021  ? ? ? ? ?BMI is Body mass index is 41.99 kg/m?., she has been working on diet and exercise. ?Wt Readings from Last 3 Encounters:  ?01/01/22 215 lb (97.5 kg)  ?12/02/21 215 lb (97.5 kg)  ?11/13/21 214 lb (97.1 kg)  ? ? ? ?Past Medical History:  ?Diagnosis Date  ? Allergy   ? Asthma   ? Obesity   ?  ? ?No Known Allergies ? ?Current Outpatient Medications on File Prior to Visit  ?Medication Sig  ? acetaminophen (TYLENOL) 500 MG tablet Take 500 mg by mouth every 6 (six) hours as needed.  ? Iron-FA-B Cmp-C-Biot-Probiotic (FUSION PLUS PO) Take by mouth.  ? metFORMIN (GLUCOPHAGE) 500 MG tablet Take by mouth 2 (two) times daily with a meal.  ? norethindrone (MICRONOR) 0.35 MG tablet Take 1 tablet by mouth daily.  ? Rivaroxaban (XARELTO) 15 MG TABS tablet Take 1 tablet (15 mg total) by mouth daily with supper.  ? ?No current facility-administered medications on file prior to visit.  ? ? ?ROS: all negative except above.  ? ?  Physical Exam: ? ?BP 138/88   Pulse 97   Temp 97.7 ?F (36.5 ?C)   Wt 215 lb (97.5 kg)   SpO2 98%   BMI 41.99 kg/m?  ? ?General Appearance: Well nourished, in no apparent distress. ?Eyes: PERRLA, EOMs, conjunctiva no swelling or erythema ?Sinuses: No Frontal/maxillary tenderness ?ENT/Mouth: Ext aud canals clear, TMs without erythema, bulging. No erythema, swelling, or exudate on post pharynx.  Tonsils not swollen or erythematous. Hearing normal.  ?Neck: Supple, thyroid normal.  ?Respiratory: Respiratory effort normal, BS equal bilaterally without rales, rhonchi, wheezing or stridor.  ?Cardio: RRR with no MRGs. Brisk peripheral pulses without edema.   ?Abdomen: Soft, + BS.  Non tender, no guarding, rebound, hernias, masses. ?Lymphatics: Non tender without lymphadenopathy.  ?Musculoskeletal: Full ROM, 5/5 strength, normal gait.  ?Skin: Warm, dry without rashes, lesions, ecchymosis.  ?Neuro: Cranial nerves intact. Normal muscle tone, no cerebellar symptoms. Sensation intact.  ?Psych: Awake and oriented X 3, normal affect, Insight and Judgment appropriate.  ?  ? ?Magda Bernheim, NP ?3:29 PM ?[facility] ? ?

## 2022-01-01 ENCOUNTER — Encounter: Payer: Self-pay | Admitting: Nurse Practitioner

## 2022-01-01 ENCOUNTER — Ambulatory Visit (INDEPENDENT_AMBULATORY_CARE_PROVIDER_SITE_OTHER): Payer: 59 | Admitting: Nurse Practitioner

## 2022-01-01 VITALS — BP 138/88 | HR 97 | Temp 97.7°F | Wt 215.0 lb

## 2022-01-01 DIAGNOSIS — D508 Other iron deficiency anemias: Secondary | ICD-10-CM

## 2022-01-01 DIAGNOSIS — I2699 Other pulmonary embolism without acute cor pulmonale: Secondary | ICD-10-CM

## 2022-01-01 DIAGNOSIS — Z7901 Long term (current) use of anticoagulants: Secondary | ICD-10-CM

## 2022-01-02 LAB — IRON, TOTAL/TOTAL IRON BINDING CAP
%SAT: 6 % (calc) — ABNORMAL LOW (ref 16–45)
Iron: 24 ug/dL — ABNORMAL LOW (ref 40–190)
TIBC: 391 mcg/dL (calc) (ref 250–450)

## 2022-01-02 LAB — CBC WITH DIFFERENTIAL/PLATELET
Absolute Monocytes: 924 cells/uL (ref 200–950)
Basophils Absolute: 56 cells/uL (ref 0–200)
Basophils Relative: 0.4 %
Eosinophils Absolute: 154 cells/uL (ref 15–500)
Eosinophils Relative: 1.1 %
HCT: 33.9 % — ABNORMAL LOW (ref 35.0–45.0)
Hemoglobin: 10.6 g/dL — ABNORMAL LOW (ref 11.7–15.5)
Lymphs Abs: 4256 cells/uL — ABNORMAL HIGH (ref 850–3900)
MCH: 24.7 pg — ABNORMAL LOW (ref 27.0–33.0)
MCHC: 31.3 g/dL — ABNORMAL LOW (ref 32.0–36.0)
MCV: 79 fL — ABNORMAL LOW (ref 80.0–100.0)
MPV: 8.4 fL (ref 7.5–12.5)
Monocytes Relative: 6.6 %
Neutro Abs: 8610 cells/uL — ABNORMAL HIGH (ref 1500–7800)
Neutrophils Relative %: 61.5 %
Platelets: 478 10*3/uL — ABNORMAL HIGH (ref 140–400)
RBC: 4.29 10*6/uL (ref 3.80–5.10)
RDW: 14 % (ref 11.0–15.0)
Total Lymphocyte: 30.4 %
WBC: 14 10*3/uL — ABNORMAL HIGH (ref 3.8–10.8)

## 2022-01-02 LAB — FERRITIN: Ferritin: 5 ng/mL — ABNORMAL LOW (ref 16–154)

## 2022-02-18 NOTE — Progress Notes (Unsigned)
Assessment and Plan: Teresa Summers was seen today for follow-up.  Diagnoses and all orders for this visit:  Pulmonary embolism on right (HCC) Continue Xarelto and follow up in 1 week, having insurance issues today Will plan follow up d dimer and clotting studies at next visit and start tapering Xarelto  Iron deficiency anemia, unspecified iron deficiency anemia type Continue Fusion plus daily Return in 1 week for labs  Major depressive disorder, single episode, moderate with anxious distress (HCC) Encouraged diet, exercise, good sleep hygiene Begin Lexapro 10 mg daily, follow up in 1 week -     escitalopram (LEXAPRO) 10 MG tablet; Take 1 tablet (10 mg total) by mouth daily.          Further disposition pending results of labs. Discussed med's effects and SE's.   Over 30 minutes of exam, counseling, chart review, and critical decision making was performed.   Future Appointments  Date Time Provider Department Center  02/19/2022  3:30 PM Teresa Humphrey, NP [facility] None  09/09/2022 10:00 AM Teresa Humphrey, NP [facility] None    ------------------------------------------------------------------------------------------------------------------   HPI BP 132/90   Pulse 95   Temp (!) 97.5 F (36.4 C)   Wt 222 lb (100.7 kg)   SpO2 99%   BMI 43.36 kg/m   31 y.o.female presents for reevaluation of anemia and PE .    She has been on Xarelto for PE and developed very lengthy  menstrual cycle and anemia.  Have been following since then . She was reevaluated in the ER on 12/12/21- CTA revealed 1.  Decreased conspicuity of the previously described filling defects. No evidence of new filling defects.  2.  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 This is often associated with sinus venosus ASD and cardiac echocardiography or cardiac MR may be of value.  3.  No acute pulmonary abnormality.  Had echo 10/31/21 which showed: Pulmonary venous  flow pattern is normal. The IVC is normal in size  with an inspiratory collapse of greater then 50%, suggesting normal  right atrial pressure.  She is uncertain of family history.  She has not had any clotting studies done.  She has an occasional chest pain but not like she previously had.  Has racing heartbeat and pressure.   Her husband had back surgery for herniated discs- micro decompression L5-S1 1 week ago. She has had more stress with this. She was on Prozac previously and it did control symptoms. She is 1 week overdue for her menstrual cycle. No thoughts of hurting self or others   She continues on Fusion Plus daily. Last CBC: Lab Results  Component Value Date   WBC 14.0 (H) 01/01/2022   HGB 10.6 (L) 01/01/2022   HCT 33.9 (L) 01/01/2022   MCV 79.0 (L) 01/01/2022   PLT 478 (H) 01/01/2022    BMI is Body mass index is 43.36 kg/m., she has been eating healthier, more fresh fruits and vegetables. She has been exercising  Wt Readings from Last 3 Encounters:  02/19/22 222 lb (100.7 kg)  01/01/22 215 lb (97.5 kg)  12/02/21 215 lb (97.5 kg)     Past Medical History:  Diagnosis Date   Allergy    Asthma    Obesity      No Known Allergies  Current Outpatient Medications on File Prior to Visit  Medication Sig   acetaminophen (TYLENOL) 500 MG tablet Take 500 mg by mouth every 6 (six) hours as needed.  Iron-FA-B Cmp-C-Biot-Probiotic (FUSION PLUS PO) Take by mouth.   metFORMIN (GLUCOPHAGE) 500 MG tablet Take by mouth 2 (two) times daily with a meal.   norethindrone (MICRONOR) 0.35 MG tablet Take 1 tablet by mouth daily.   Rivaroxaban (XARELTO) 15 MG TABS tablet Take 1 tablet (15 mg total) by mouth daily with supper.   No current facility-administered medications on file prior to visit.    ROS: all negative except above.   Physical Exam:  BP 132/90   Pulse 95   Temp (!) 97.5 F (36.4 C)   Wt 222 lb (100.7 kg)   SpO2 99%   BMI 43.36 kg/m   General Appearance: Well  nourished, in no apparent distress. Eyes: PERRLA, EOMs, conjunctiva no swelling or erythema Sinuses: No Frontal/maxillary tenderness ENT/Mouth: Ext aud canals clear, TMs without erythema, bulging. No erythema, swelling, or exudate on post pharynx.  Tonsils not swollen or erythematous. Hearing normal.  Neck: Supple, thyroid normal.  Respiratory: Respiratory effort normal, BS equal bilaterally without rales, rhonchi, wheezing or stridor.  Cardio: RRR with no MRGs. Brisk peripheral pulses without edema.  Abdomen: Soft, + BS.  Non tender, no guarding, rebound, hernias, masses. Lymphatics: Non tender without lymphadenopathy.  Musculoskeletal: Full ROM, 5/5 strength, normal gait.  Skin: Warm, dry without rashes, lesions, ecchymosis.  Neuro: Cranial nerves intact. Normal muscle tone, no cerebellar symptoms. Sensation intact.  Psych: Awake and oriented X 3, normal affect, Insight and Judgment appropriate.     Teresa Humphrey, NP 3:28 PM [facility]

## 2022-02-19 ENCOUNTER — Ambulatory Visit (INDEPENDENT_AMBULATORY_CARE_PROVIDER_SITE_OTHER): Payer: BC Managed Care – PPO | Admitting: Nurse Practitioner

## 2022-02-19 ENCOUNTER — Encounter: Payer: Self-pay | Admitting: Nurse Practitioner

## 2022-02-19 VITALS — BP 132/90 | HR 95 | Temp 97.5°F | Wt 222.0 lb

## 2022-02-19 DIAGNOSIS — F321 Major depressive disorder, single episode, moderate: Secondary | ICD-10-CM | POA: Diagnosis not present

## 2022-02-19 DIAGNOSIS — I2699 Other pulmonary embolism without acute cor pulmonale: Secondary | ICD-10-CM

## 2022-02-19 DIAGNOSIS — D509 Iron deficiency anemia, unspecified: Secondary | ICD-10-CM

## 2022-02-19 MED ORDER — ESCITALOPRAM OXALATE 10 MG PO TABS: 10.0000 mg | ORAL_TABLET | Freq: Every day | ORAL | 2 refills | Status: DC

## 2022-02-23 ENCOUNTER — Encounter: Payer: Self-pay | Admitting: Nurse Practitioner

## 2022-02-23 DIAGNOSIS — R079 Chest pain, unspecified: Secondary | ICD-10-CM | POA: Diagnosis not present

## 2022-02-23 DIAGNOSIS — R0789 Other chest pain: Secondary | ICD-10-CM | POA: Diagnosis not present

## 2022-02-24 DIAGNOSIS — R079 Chest pain, unspecified: Secondary | ICD-10-CM | POA: Diagnosis not present

## 2022-02-24 DIAGNOSIS — R000 Tachycardia, unspecified: Secondary | ICD-10-CM | POA: Diagnosis not present

## 2022-02-24 NOTE — Progress Notes (Signed)
Factor Assessment and Plan:  Teresa Summers was seen today for follow-up.  Diagnoses and all orders for this visit:  Pulmonary embolism on right (HCC) Complete remaining Xarelto then d/c Return to office in 4 weeks for clotting studies to r/o inherited clotting disorder  Chest pain of uncertain etiology EKG NSR no ST changes today Continue to monitor symptoms and will refer to Cardiology, if cardiology rules out cardiac cause will pursue anxiety as cause -     Ambulatory referral to Cardiology -     EKG 12-Lead  Anxiety Unable to tolerate Lexapro, caused nausea and vomiting Will hold off on meds until evaluated at cardiology, if cardiology rules out cardiac cause of chest pain will further work on treatment plan for control of anxiety Will continue deep breathing exercises, recommend finding therapist      Further disposition pending results of labs. Discussed med's effects and SE's.   Over 30 minutes of exam, counseling, chart review, and critical decision making was performed.   Future Appointments  Date Time Provider Department Center  09/09/2022 10:00 AM Raynelle Dick, NP [facility] None    ------------------------------------------------------------------------------------------------------------------   HPI BP 122/84   Pulse (!) 101   Temp (!) 97.3 F (36.3 C)   Wt 215 lb 3.2 oz (97.6 kg)   SpO2 99%   BMI 42.03 kg/m   31 y.o.female presents for Follow up from ER visit for chest pain on 02/23/22. She was having horrible chest pain that felt like someone was sitting on her chest and pain was going down her arms. She states the Pain is improved but still having some discomfort. She was concerned because while at ER they noted a QRS shift from previous EKG. Would like recheck today You were seen in the Emergency Department today for chest pain.   While you were here, we check your vital signs, performed a physical exam, and checked an EKG, chest x-ray, CT scan, and labs.  These were all reassuring, and showed no emergency cause of your symptoms today, including no signs of a heart attack at this time.  She had previous PE and has completed her 3 months of Xarelto and is now weaning off medication.  She is currently taking 1/2 tab daily for this week then will go to 1/2 tab every other day for a week.     BMI is Body mass index is 42.03 kg/m., she has not been working on diet and exercise. Diarrhea is occurring up to 7 times a day 6-7 on stool chart. Wt Readings from Last 3 Encounters:  02/26/22 215 lb 3.2 oz (97.6 kg)  02/19/22 222 lb (100.7 kg)  01/01/22 215 lb (97.5 kg)     Past Medical History:  Diagnosis Date   Allergy    Asthma    Obesity    No Known Allergies  Current Outpatient Medications on File Prior to Visit  Medication Sig   acetaminophen (TYLENOL) 500 MG tablet Take 500 mg by mouth every 6 (six) hours as needed.   Iron-FA-B Cmp-C-Biot-Probiotic (FUSION PLUS PO) Take by mouth.   metFORMIN (GLUCOPHAGE) 500 MG tablet Take by mouth 2 (two) times daily with a meal.   norethindrone (MICRONOR) 0.35 MG tablet Take 1 tablet by mouth daily.   Rivaroxaban (XARELTO) 15 MG TABS tablet Take 1 tablet (15 mg total) by mouth daily with supper.   escitalopram (LEXAPRO) 10 MG tablet Take 1 tablet (10 mg total) by mouth daily. (Patient not taking: Reported on 02/26/2022)  No current facility-administered medications on file prior to visit.    ROS: all negative except above.   Physical Exam:  BP 122/84   Pulse (!) 101   Temp (!) 97.3 F (36.3 C)   Wt 215 lb 3.2 oz (97.6 kg)   SpO2 99%   BMI 42.03 kg/m   General Appearance: Well nourished, in no apparent distress. Eyes: PERRLA, EOMs, conjunctiva no swelling or erythema Sinuses: No Frontal/maxillary tenderness ENT/Mouth: Ext aud canals clear, TMs without erythema, bulging. No erythema, swelling, or exudate on post pharynx.  Tonsils not swollen or erythematous. Hearing normal.  Neck: Supple,  thyroid normal.  Respiratory: Respiratory effort normal, BS equal bilaterally without rales, rhonchi, wheezing or stridor.  Cardio: RRR with no MRGs. Brisk peripheral pulses without edema.  Abdomen: Soft, + BS.  Non tender, no guarding, rebound, hernias, masses. Lymphatics: Non tender without lymphadenopathy.  Musculoskeletal: Full ROM, 5/5 strength, normal gait.  Skin: Warm, dry without rashes, lesions, ecchymosis.  Neuro: Cranial nerves intact. Normal muscle tone, no cerebellar symptoms. Sensation intact.  Psych: Awake and oriented X 3, normal affect, Insight and Judgment appropriate.  EKG: NSR, no ST changes  Teresa Summers Hollie Salk, NP 3:34 PM [facility]

## 2022-02-26 ENCOUNTER — Ambulatory Visit (INDEPENDENT_AMBULATORY_CARE_PROVIDER_SITE_OTHER): Payer: BC Managed Care – PPO | Admitting: Nurse Practitioner

## 2022-02-26 ENCOUNTER — Encounter: Payer: Self-pay | Admitting: Nurse Practitioner

## 2022-02-26 VITALS — BP 122/84 | HR 101 | Temp 97.3°F | Wt 215.2 lb

## 2022-02-26 DIAGNOSIS — F419 Anxiety disorder, unspecified: Secondary | ICD-10-CM | POA: Diagnosis not present

## 2022-02-26 DIAGNOSIS — I2699 Other pulmonary embolism without acute cor pulmonale: Secondary | ICD-10-CM | POA: Diagnosis not present

## 2022-02-26 DIAGNOSIS — R079 Chest pain, unspecified: Secondary | ICD-10-CM | POA: Diagnosis not present

## 2022-02-28 ENCOUNTER — Other Ambulatory Visit: Payer: Self-pay | Admitting: Nurse Practitioner

## 2022-02-28 DIAGNOSIS — I2699 Other pulmonary embolism without acute cor pulmonale: Secondary | ICD-10-CM

## 2022-03-04 ENCOUNTER — Encounter: Payer: Self-pay | Admitting: Nurse Practitioner

## 2022-03-14 ENCOUNTER — Other Ambulatory Visit: Payer: Self-pay | Admitting: Nurse Practitioner

## 2022-03-14 DIAGNOSIS — R000 Tachycardia, unspecified: Secondary | ICD-10-CM | POA: Diagnosis not present

## 2022-03-14 DIAGNOSIS — R072 Precordial pain: Secondary | ICD-10-CM | POA: Diagnosis not present

## 2022-03-14 DIAGNOSIS — R079 Chest pain, unspecified: Secondary | ICD-10-CM | POA: Diagnosis not present

## 2022-03-14 DIAGNOSIS — F321 Major depressive disorder, single episode, moderate: Secondary | ICD-10-CM

## 2022-03-14 DIAGNOSIS — R002 Palpitations: Secondary | ICD-10-CM | POA: Diagnosis not present

## 2022-03-25 NOTE — Progress Notes (Unsigned)
Assessment and Plan:  There are no diagnoses linked to this encounter.    Further disposition pending results of labs. Discussed med's effects and SE's.   Over 30 minutes of exam, counseling, chart review, and critical decision making was performed.   Future Appointments  Date Time Provider Department Center  03/26/2022  3:30 PM Raynelle Dick, NP [facility] None  09/09/2022 10:00 AM Raynelle Dick, NP [facility] None    ------------------------------------------------------------------------------------------------------------------   HPI There were no vitals taken for this visit. 31 y.o.female presents for  Past Medical History:  Diagnosis Date   Allergy    Asthma    Obesity      No Known Allergies  Current Outpatient Medications on File Prior to Visit  Medication Sig   acetaminophen (TYLENOL) 500 MG tablet Take 500 mg by mouth every 6 (six) hours as needed.   escitalopram (LEXAPRO) 10 MG tablet TAKE 1 TABLET BY MOUTH EVERY DAY   Iron-FA-B Cmp-C-Biot-Probiotic (FUSION PLUS PO) Take by mouth.   metFORMIN (GLUCOPHAGE) 500 MG tablet Take by mouth 2 (two) times daily with a meal.   norethindrone (MICRONOR) 0.35 MG tablet Take 1 tablet by mouth daily.   Rivaroxaban (XARELTO) 15 MG TABS tablet Take 1 tablet (15 mg total) by mouth daily with supper.   No current facility-administered medications on file prior to visit.    ROS: all negative except above.   Physical Exam:  There were no vitals taken for this visit.  General Appearance: Well nourished, in no apparent distress. Eyes: PERRLA, EOMs, conjunctiva no swelling or erythema Sinuses: No Frontal/maxillary tenderness ENT/Mouth: Ext aud canals clear, TMs without erythema, bulging. No erythema, swelling, or exudate on post pharynx.  Tonsils not swollen or erythematous. Hearing normal.  Neck: Supple, thyroid normal.  Respiratory: Respiratory effort normal, BS equal bilaterally without rales, rhonchi, wheezing or  stridor.  Cardio: RRR with no MRGs. Brisk peripheral pulses without edema.  Abdomen: Soft, + BS.  Non tender, no guarding, rebound, hernias, masses. Lymphatics: Non tender without lymphadenopathy.  Musculoskeletal: Full ROM, 5/5 strength, normal gait.  Skin: Warm, dry without rashes, lesions, ecchymosis.  Neuro: Cranial nerves intact. Normal muscle tone, no cerebellar symptoms. Sensation intact.  Psych: Awake and oriented X 3, normal affect, Insight and Judgment appropriate.     Raynelle Dick, NP 8:58 AM [facility]

## 2022-03-26 ENCOUNTER — Encounter: Payer: Self-pay | Admitting: Nurse Practitioner

## 2022-03-26 ENCOUNTER — Ambulatory Visit (INDEPENDENT_AMBULATORY_CARE_PROVIDER_SITE_OTHER): Payer: BC Managed Care – PPO | Admitting: Nurse Practitioner

## 2022-03-26 VITALS — BP 118/80 | HR 102 | Temp 97.7°F | Wt 213.4 lb

## 2022-03-26 DIAGNOSIS — Z86711 Personal history of pulmonary embolism: Secondary | ICD-10-CM | POA: Diagnosis not present

## 2022-03-26 DIAGNOSIS — F419 Anxiety disorder, unspecified: Secondary | ICD-10-CM | POA: Diagnosis not present

## 2022-03-26 DIAGNOSIS — Z832 Family history of diseases of the blood and blood-forming organs and certain disorders involving the immune mechanism: Secondary | ICD-10-CM | POA: Diagnosis not present

## 2022-03-26 DIAGNOSIS — R079 Chest pain, unspecified: Secondary | ICD-10-CM

## 2022-03-27 LAB — PROTIME-INR
INR: 1
Prothrombin Time: 10.3 s (ref 9.0–11.5)

## 2022-03-28 LAB — PROTEIN C ACTIVITY: Protein C Activity: 178 % normal (ref 70–180)

## 2022-04-02 LAB — PROTHROMBIN GENE MUTATION: PROTHROMBIN (FACTOR II): NEGATIVE

## 2022-04-02 LAB — APTT: aPTT: 28 s (ref 23–32)

## 2022-04-02 LAB — FACTOR 5 LEIDEN: Result: NEGATIVE

## 2022-04-14 DIAGNOSIS — R079 Chest pain, unspecified: Secondary | ICD-10-CM | POA: Diagnosis not present

## 2022-04-28 DIAGNOSIS — R079 Chest pain, unspecified: Secondary | ICD-10-CM | POA: Diagnosis not present

## 2022-05-04 DIAGNOSIS — I491 Atrial premature depolarization: Secondary | ICD-10-CM | POA: Diagnosis not present

## 2022-05-04 DIAGNOSIS — I493 Ventricular premature depolarization: Secondary | ICD-10-CM | POA: Diagnosis not present

## 2022-05-05 DIAGNOSIS — H10413 Chronic giant papillary conjunctivitis, bilateral: Secondary | ICD-10-CM | POA: Diagnosis not present

## 2022-05-05 DIAGNOSIS — Z0100 Encounter for examination of eyes and vision without abnormal findings: Secondary | ICD-10-CM | POA: Diagnosis not present

## 2022-05-19 ENCOUNTER — Encounter: Payer: Self-pay | Admitting: Nurse Practitioner

## 2022-07-06 ENCOUNTER — Encounter: Payer: Self-pay | Admitting: Nurse Practitioner

## 2022-09-08 DIAGNOSIS — E782 Mixed hyperlipidemia: Secondary | ICD-10-CM | POA: Insufficient documentation

## 2022-09-08 DIAGNOSIS — R7309 Other abnormal glucose: Secondary | ICD-10-CM | POA: Insufficient documentation

## 2022-09-08 NOTE — Progress Notes (Deleted)
 Complete Physical  Assessment and Plan: Teresa Summers was seen today for annual exam.  Diagnoses and all orders for this visit:  Encounter for general adult medical examination with abnormal findings Due Yearly  Major depressive disorder, single episode, moderate with anxious distress (HCC) Currently controlled without medication Continue behavior modification, diet and exercise  Morbid obesity (HCC) Long discussion about weight loss, diet, and exercise Recommended diet heavy in fruits and veggies and low in animal meats, cheeses, and dairy products, appropriate calorie intake Follow up at next visit  -     COMPLETE METABOLIC PANEL WITH GFR -     TSH  Migraine without aura and without status migrainosus, not intractable Well Controlled without medication  Iron deficiency anemia, unspecified iron deficiency anemia type -     CBC with Differential/Platelet  PCOS (polycystic ovarian syndrome) Continue Metformin, diet and exercise  Medication management -     Magnesium  Abnormal glucose Continue diet and exercise -     Hemoglobin A1c  Hyperlipidemia Continue diet and exercise -     Lipid panel  Screening for hematuria or proteinuria -     Urinalysis, Routine w reflex microscopic -     Microalbumin / creatinine urine ratio  Screening for thyroid disorder -     TSH  Vitamin D deficiency -     VITAMIN D 25 Hydroxy (Vit-D Deficiency, Fractures)     Discussed med's effects and SE's. Screening labs and tests as requested with regular follow-up as recommended. Over 40 minutes of exam, counseling, chart review, and complex, high level critical decision making was performed this visit.   HPI  31 y.o. female  presents for a complete physical and follow up for has Morbid obesity (HCC); Allergy; Panic attack; Iron deficiency anemia; Prediabetes; PCOS (polycystic ovarian syndrome); Migraines; Major depressive disorder, single episode, moderate with anxious distress (HCC); Diarrhea;  and Vitamin D deficiency on their problem list..  She is having severe episodes of diarrhea x 45 days- has fatigue, weakness, diarrhea ,vomiting and nausea.  Denies blood and mucus in stool.  She brought stool cultures today for evaluation. She is getting intermittent abdominal pain and bloating . She can not get a GI appointment until 10/10/20 with [facility]. It will awaken her at night to have diarrhea.   She has swollen tonsils and was seen at Urgent care yesterday and is starting on an antibiotic.   Her blood pressure has been controlled at home, today their BP is   BP Readings from Last 3 Encounters:  03/26/22 118/80  02/26/22 122/84  02/19/22 132/90    She does not workout. She denies chest pain, shortness of breath, dizziness.   She is not on cholesterol medication and denies myalgias. Her cholesterol is not at goal. The cholesterol last visit was:   Lab Results  Component Value Date   CHOL 183 09/01/2021   HDL 32 (L) 09/01/2021   LDLCALC 122 (H) 09/01/2021   TRIG 176 (H) 09/01/2021   CHOLHDL 5.7 (H) 09/01/2021   BMI is There is no height or weight on file to calculate BMI., she has not been working on diet and exercise. In September 265 and has lost almost 40 pounds due to diarrhea and nausea Wt Readings from Last 3 Encounters:  03/26/22 213 lb 6.4 oz (96.8 kg)  02/26/22 215 lb 3.2 oz (97.6 kg)  02/19/22 222 lb (100.7 kg)     She has not been working on diet and exercise for abnormal  glucose Lab Results  Component Value Date   HGBA1C 5.4 09/01/2021    Last GFR: Lab Results  Component Value Date   GFRNONAA 123 11/08/2019   Lab Results  Component Value Date   GFRAA 143 11/08/2019    Patient is on Vitamin D supplement.   Lab Results  Component Value Date   VD25OH 11 (L) 09/01/2021      Current Medications:  Current Outpatient Medications on File Prior to Visit  Medication Sig Dispense Refill   acetaminophen (TYLENOL) 500 MG tablet  Take 500 mg by mouth every 6 (six) hours as needed.     Iron-FA-B Cmp-C-Biot-Probiotic (FUSION PLUS PO) Take by mouth.     metFORMIN (GLUCOPHAGE) 500 MG tablet Take by mouth 2 (two) times daily with a meal.     norethindrone (MICRONOR) 0.35 MG tablet Take 1 tablet by mouth daily.     pantoprazole (PROTONIX) 20 MG tablet Take 20 mg by mouth daily.     sucralfate (CARAFATE) 1 g tablet Take 1 g by mouth 4 (four) times daily -  with meals and at bedtime.     No current facility-administered medications on file prior to visit.   Allergies:  No Known Allergies Medical History:  She has Morbid obesity ([place]); Allergy; Panic attack; Iron deficiency anemia; Prediabetes; PCOS (polycystic ovarian syndrome); Migraines; Major depressive disorder, single episode, moderate with anxious distress ([place]); Diarrhea; and Vitamin D deficiency on their problem list. Health Maintenance:   Immunization History  Administered Date(s) Administered   Td 07/11/2010    Tetanus: Pneumovax: Prevnar 13:  Flu vaccine: Zostavax: LMP: Pap: MGM:  DEXA: Colonoscopy: EGD:  Last Dental Exam: Last Eye Exam: Patient Care Team: Unk Pinto, MD as PCP - General (Internal Medicine)  Surgical History:  She has a past surgical history that includes abcess tooth (2010); wisdom teeth (2009); and Cholecystectomy (10/2011). Family History:  Herfamily history includes Ovarian cancer in her maternal aunt and maternal grandmother. She was adopted. Social History:  She reports that she has never smoked. She has never used smokeless tobacco. She reports current alcohol use. She reports that she does not use drugs.  Review of Systems: Review of Systems  Constitutional:  Positive for malaise/fatigue and weight loss. Negative for chills and fever.  HENT:  Positive for sore throat. Negative for congestion, hearing loss, sinus pain and tinnitus.   Eyes:  Negative for blurred vision and double vision.  Respiratory:  Negative  for cough, hemoptysis, sputum production, shortness of breath and wheezing.   Cardiovascular:  Negative for chest pain, palpitations and leg swelling.  Gastrointestinal:  Positive for diarrhea. Negative for abdominal pain, constipation, heartburn, nausea and vomiting.  Genitourinary:  Negative for dysuria and urgency.  Musculoskeletal:  Negative for back pain, falls, joint pain, myalgias and neck pain.  Skin:  Negative for rash.  Neurological:  Negative for dizziness, tingling, tremors, weakness and headaches.  Endo/Heme/Allergies:  Does not bruise/bleed easily.  Psychiatric/Behavioral:  Negative for depression and suicidal ideas. The patient is not nervous/anxious and does not have insomnia.     Physical Exam: Estimated body mass index is 41.68 kg/m as calculated from the following:   Height as of 09/01/21: 5' (1.524 m).   Weight as of 03/26/22: 213 lb 6.4 oz (96.8 kg). There were no vitals taken for this visit. General Appearance: Well nourished, in no apparent distress.  Eyes: PERRLA, EOMs, conjunctiva no swelling or erythema, normal fundi and vessels.  Sinuses: No Frontal/maxillary tenderness  ENT/Mouth: Ext aud  canals clear, normal light reflex with TMs without erythema, bulging. Good dentition. Tonsils swollen with erythema and white exudate. Hearing normal.  Neck: Supple, thyroid normal. No bruits  Respiratory: Respiratory effort normal, BS equal bilaterally without rales, rhonchi, wheezing or stridor.  Cardio: RRR without murmurs, rubs or gallops. Brisk peripheral pulses without edema.  Chest: symmetric, with normal excursions and percussion.  Breasts: Defer to GYN Abdomen: Positive bowel sounds all 4 quadrants, Soft, nontender, no guarding, rebound, hernias, masses, or organomegaly.  Lymphatics: Non tender without lymphadenopathy.  Genitourinary: defer to GYN Musculoskeletal: Full ROM all peripheral extremities,5/5 strength, and normal gait.  Skin: Warm, dry without rashes,  lesions, ecchymosis. Neuro: Cranial nerves intact, reflexes equal bilaterally. Normal muscle tone, no cerebellar symptoms. Sensation intact.  Psych: Awake and oriented X 3, normal affect, Insight and Judgment appropriate.    Leeya Rusconi E  11:56 AM [facility]

## 2022-09-09 ENCOUNTER — Encounter: Payer: BC Managed Care – PPO | Admitting: Nurse Practitioner

## 2022-10-13 ENCOUNTER — Encounter: Payer: BC Managed Care – PPO | Admitting: Nurse Practitioner

## 2022-10-17 NOTE — Progress Notes (Deleted)
Complete Physical  Assessment and Plan: Teresa Summers was seen today for annual exam.  Diagnoses and all orders for this visit:  Encounter for general adult medical examination with abnormal findings Due Yearly  Major depressive disorder, single episode, moderate with anxious distress ([place]) Currently controlled without medication Continue behavior modification, diet and exercise  Morbid obesity ([place]) Long discussion about weight loss, diet, and exercise Recommended diet heavy in fruits and veggies and low in animal meats, cheeses, and dairy products, appropriate calorie intake Follow up at next visit  -     COMPLETE METABOLIC PANEL WITH GFR -     TSH  Mixed Hyperlipidemia Continue diet and exercise - Lipid panel  History of PE Continue to monitor for symptoms Avoid estrogen birth control  Migraine without aura and without status migrainosus, not intractable Well Controlled without medication  Iron deficiency anemia, unspecified iron deficiency anemia type -     CBC with Differential/Platelet  PCOS (polycystic ovarian syndrome) Continue Metformin, diet and exercise  Medication management -     Magnesium  Abnormal glucose Continue diet and exercise -     Hemoglobin A1c  Screening, lipid -     Lipid panel  Screening for hematuria or proteinuria -     Urinalysis, Routine w reflex microscopic -     Microalbumin / creatinine urine ratio  Screening for thyroid disorder -     TSH  Vitamin D deficiency -     VITAMIN D 25 Hydroxy (Vit-D Deficiency, Fractures)     Discussed med's effects and SE's. Screening labs and tests as requested with regular follow-up as recommended. Over 40 minutes of exam, counseling, chart review, and complex, high level critical decision making was performed this visit.   HPI  32 y.o. female  presents for a complete physical and follow up for has Morbid obesity (Teresa Summers); Allergy; Panic attack; Iron deficiency anemia; Prediabetes; PCOS  (polycystic ovarian syndrome); Migraines; Major depressive disorder, single episode, moderate with anxious distress (Teresa Summers); Diarrhea; Vitamin D deficiency; Mixed hyperlipidemia; and Abnormal glucose on their problem list..  She is having severe episodes of diarrhea x 45 days- has fatigue, weakness, diarrhea ,vomiting and nausea.  Denies blood and mucus in stool.  She brought stool cultures today for evaluation. She is getting intermittent abdominal pain and bloating . She can not get a GI appointment until 10/10/20 with [facility]. It will awaken her at night to have diarrhea.   She has swollen tonsils and was seen at Urgent care yesterday and is starting on an antibiotic.   Her blood pressure has been controlled at home, today their BP is   BP Readings from Last 3 Encounters:  03/26/22 118/80  02/26/22 122/84  02/19/22 132/90    She does not workout. She denies chest pain, shortness of breath, dizziness.   She is not on cholesterol medication and denies myalgias. Her cholesterol is not at goal. The cholesterol last visit was:   Lab Results  Component Value Date   CHOL 183 09/01/2021   HDL 32 (L) 09/01/2021   LDLCALC 122 (H) 09/01/2021   TRIG 176 (H) 09/01/2021   CHOLHDL 5.7 (H) 09/01/2021   BMI is There is no height or weight on file to calculate BMI., she has not been working on diet and exercise. In September 265 and has lost almost 40 pounds due to diarrhea and nausea Wt Readings from Last 3 Encounters:  03/26/22 213 lb 6.4 oz (96.8 kg)  02/26/22 215 lb 3.2  oz (97.6 kg)  02/19/22 222 lb (100.7 kg)     She has not been working on diet and exercise for abnormal glucose Lab Results  Component Value Date   HGBA1C 5.4 09/01/2021    Last GFR: Lab Results  Component Value Date   GFRNONAA 123 11/08/2019   Lab Results  Component Value Date   GFRAA 143 11/08/2019    Patient is on Vitamin D supplement.   Lab Results  Component Value Date   VD25OH 11 (L)  09/01/2021      Current Medications:  Current Outpatient Medications on File Prior to Visit  Medication Sig Dispense Refill   acetaminophen (TYLENOL) 500 MG tablet Take 500 mg by mouth every 6 (six) hours as needed.     Iron-FA-B Cmp-C-Biot-Probiotic (FUSION PLUS PO) Take by mouth.     metFORMIN (GLUCOPHAGE) 500 MG tablet Take by mouth 2 (two) times daily with a meal.     norethindrone (MICRONOR) 0.35 MG tablet Take 1 tablet by mouth daily.     pantoprazole (PROTONIX) 20 MG tablet Take 20 mg by mouth daily.     sucralfate (CARAFATE) 1 g tablet Take 1 g by mouth 4 (four) times daily -  with meals and at bedtime.     No current facility-administered medications on file prior to visit.   Allergies:  No Known Allergies Medical History:  She has Morbid obesity (Teresa Summers); Allergy; Panic attack; Iron deficiency anemia; Prediabetes; PCOS (polycystic ovarian syndrome); Migraines; Major depressive disorder, single episode, moderate with anxious distress (Teresa Summers); Diarrhea; Vitamin D deficiency; Mixed hyperlipidemia; and Abnormal glucose on their problem list. Health Maintenance:   Immunization History  Administered Date(s) Administered   Td 07/11/2010    Tetanus: Pneumovax: Prevnar 13:  Flu vaccine: Zostavax: LMP: Pap: MGM:  DEXA: Colonoscopy: EGD:  Last Dental Exam: Last Eye Exam: Patient Care Team: Unk Pinto, MD as PCP - General (Internal Medicine)  Surgical History:  She has a past surgical history that includes abcess tooth (2010); wisdom teeth (2009); and Cholecystectomy (10/2011). Family History:  Herfamily history includes Ovarian cancer in her maternal aunt and maternal grandmother. She was adopted. Social History:  She reports that she has never smoked. She has never used smokeless tobacco. She reports current alcohol use. She reports that she does not use drugs.  Review of Systems: Review of Systems  Constitutional:  Positive for malaise/fatigue and weight loss.  Negative for chills and fever.  HENT:  Positive for sore throat. Negative for congestion, hearing loss, sinus pain and tinnitus.   Eyes:  Negative for blurred vision and double vision.  Respiratory:  Negative for cough, hemoptysis, sputum production, shortness of breath and wheezing.   Cardiovascular:  Negative for chest pain, palpitations and leg swelling.  Gastrointestinal:  Positive for diarrhea. Negative for abdominal pain, constipation, heartburn, nausea and vomiting.  Genitourinary:  Negative for dysuria and urgency.  Musculoskeletal:  Negative for back pain, falls, joint pain, myalgias and neck pain.  Skin:  Negative for rash.  Neurological:  Negative for dizziness, tingling, tremors, weakness and headaches.  Endo/Heme/Allergies:  Does not bruise/bleed easily.  Psychiatric/Behavioral:  Negative for depression and suicidal ideas. The patient is not nervous/anxious and does not have insomnia.     Physical Exam: Estimated body mass index is 41.68 kg/m as calculated from the following:   Height as of 09/01/21: 5' (1.524 m).   Weight as of 03/26/22: 213 lb 6.4 oz (96.8 kg). There were no vitals taken for this visit. General Appearance:  Well nourished, in no apparent distress.  Eyes: PERRLA, EOMs, conjunctiva no swelling or erythema, normal fundi and vessels.  Sinuses: No Frontal/maxillary tenderness  ENT/Mouth: Ext aud canals clear, normal light reflex with TMs without erythema, bulging. Good dentition. Tonsils swollen with erythema and white exudate. Hearing normal.  Neck: Supple, thyroid normal. No bruits  Respiratory: Respiratory effort normal, BS equal bilaterally without rales, rhonchi, wheezing or stridor.  Cardio: RRR without murmurs, rubs or gallops. Brisk peripheral pulses without edema.  Chest: symmetric, with normal excursions and percussion.  Breasts: Defer to GYN Abdomen: Positive bowel sounds all 4 quadrants, Soft, nontender, no guarding, rebound, hernias, masses, or  organomegaly.  Lymphatics: Non tender without lymphadenopathy.  Genitourinary: defer to GYN Musculoskeletal: Full ROM all peripheral extremities,5/5 strength, and normal gait.  Skin: Warm, dry without rashes, lesions, ecchymosis. Neuro: Cranial nerves intact, reflexes equal bilaterally. Normal muscle tone, no cerebellar symptoms. Sensation intact.  Psych: Awake and oriented X 3, normal affect, Insight and Judgment appropriate.    Jordi Lacko Mikki Santee 3:23 PM [facility]

## 2022-10-19 ENCOUNTER — Encounter: Payer: BC Managed Care – PPO | Admitting: Nurse Practitioner

## 2022-10-19 DIAGNOSIS — R7309 Other abnormal glucose: Secondary | ICD-10-CM

## 2022-10-19 DIAGNOSIS — G43009 Migraine without aura, not intractable, without status migrainosus: Secondary | ICD-10-CM

## 2022-10-19 DIAGNOSIS — F321 Major depressive disorder, single episode, moderate: Secondary | ICD-10-CM

## 2022-10-19 DIAGNOSIS — F419 Anxiety disorder, unspecified: Secondary | ICD-10-CM

## 2022-10-19 DIAGNOSIS — Z1329 Encounter for screening for other suspected endocrine disorder: Secondary | ICD-10-CM

## 2022-10-19 DIAGNOSIS — Z0001 Encounter for general adult medical examination with abnormal findings: Secondary | ICD-10-CM

## 2022-10-19 DIAGNOSIS — E782 Mixed hyperlipidemia: Secondary | ICD-10-CM

## 2022-10-19 DIAGNOSIS — E559 Vitamin D deficiency, unspecified: Secondary | ICD-10-CM

## 2022-10-19 DIAGNOSIS — Z86711 Personal history of pulmonary embolism: Secondary | ICD-10-CM

## 2022-10-19 DIAGNOSIS — Z1389 Encounter for screening for other disorder: Secondary | ICD-10-CM

## 2022-10-19 DIAGNOSIS — Z79899 Other long term (current) drug therapy: Secondary | ICD-10-CM

## 2022-10-19 LAB — RESULTS CONSOLE HPV: CHL HPV: NEGATIVE

## 2022-10-19 LAB — HM PAP SMEAR: HM Pap smear: NEGATIVE

## 2022-10-23 NOTE — Progress Notes (Unsigned)
 Complete Physical  Assessment and Plan: Teresa Summers was seen today for annual exam.  Diagnoses and all orders for this visit:  Encounter for general adult medical examination with abnormal findings Due Yearly  Major depressive disorder, single episode, moderate with anxious distress ([place]) Currently controlled without medication Continue behavior modification, diet and exercise  Morbid obesity ([place]) Long discussion about weight loss, diet, and exercise Recommended diet heavy in fruits and veggies and low in animal meats, cheeses, and dairy products, appropriate calorie intake Follow up at next visit  Continue diet and exercise -     COMPLETE METABOLIC PANEL WITH GFR -     TSH  Mixed Hyperlipidemia Continue diet and exercise - Lipid panel  History of PE Continue to monitor for symptoms Avoid estrogen birth control- continue Progestin only OC  Migraine without aura and without status migrainosus, not intractable Well Controlled without medication  Iron deficiency anemia, unspecified iron deficiency anemia type Took course of iron after very irregular periods with Xarelto, continue iron supplementation -     CBC with Differential/Platelet  PCOS (polycystic ovarian syndrome) Continue Metformin, diet and exercise Start Ozempic 0.5mg  SQ QW #3 ml 3 refills sent to the pharmacy Follow up in 3 months  Medication management -     Magnesium  Abnormal glucose Continue diet and exercise -     Hemoglobin A1c  Screening, lipid -     Lipid panel  Screening for hematuria or proteinuria -     Urinalysis with microscopic + reflex culture -     Microalbumin / creatinine urine ratio  Screening for thyroid disorder -     TSH  Vitamin D deficiency Encourage Vit D supplementation to maintain value in therapeutic level of 60-100  -     VITAMIN D 25 Hydroxy (Vit-D D/eficiency, Fractures)     Discussed med's effects and SE's. Screening labs and tests as requested with regular  follow-up as recommended. Over 40 minutes of exam, counseling, chart review, and complex, high level critical decision making was performed this visit.   HPI  32 y.o. female  presents for a complete physical and follow up for has Morbid obesity ([place]); Allergy; Panic attack; Iron deficiency anemia; Prediabetes; PCOS (polycystic ovarian syndrome); Migraines; Major depressive disorder, single episode, moderate with anxious distress ([place]); Diarrhea; Vitamin D deficiency; Mixed hyperlipidemia; and Abnormal glucose on their problem list..  She is still having issues with Migraines- 2-4 a month.  Tylenol works early and if that does not work Advil will relieve.   Patient has a history of Pulmonary Embolism 10/30/21 but has completed Xarelto therapy and is on progestin only OC's for contraception.   Her blood pressure has been controlled at home, today their BP is BP: 122/84 BP Readings from Last 3 Encounters:  10/26/22 122/84  03/26/22 118/80  02/26/22 122/84  She does not workout. She denies chest pain, shortness of breath, dizziness.   She is not on cholesterol medication and denies myalgias. Her cholesterol is not at goal. The cholesterol last visit was:   Lab Results  Component Value Date   CHOL 183 09/01/2021   HDL 32 (L) 09/01/2021   LDLCALC 122 (H) 09/01/2021   TRIG 176 (H) 09/01/2021   CHOLHDL 5.7 (H) 09/01/2021   BMI is Body mass index is 44.18 kg/m., she has not been working on diet and exercise. She has been exercising at the gym.  She will do the treadmill and light weights. She has removed added sugars.  She is limiting  simple carbs. She is doing protein shakes and trying to increase protein. Her husband and her are doing eating plan together.  Wt Readings from Last 3 Encounters:  10/26/22 226 lb 3.2 oz (102.6 kg)  03/26/22 213 lb 6.4 oz (96.8 kg)  02/26/22 215 lb 3.2 oz (97.6 kg)     She has not been working on diet and exercise for abnormal glucose. She is on Metformin 500 mg  BID for PCOS and insulin resistance Lab Results  Component Value Date   HGBA1C 5.4 09/01/2021    Last GFR: Lab Results  Component Value Date   EGFR 129 11/13/2021   Has dealt with iron deficiency anemia due to irregular periods on Xarelto.  Has been off Xarelto over 6 months now. Continues on Progestin only OC.  Lab Results  Component Value Date   WBC 14.0 (H) 01/01/2022   HGB 10.6 (L) 01/01/2022   HCT 33.9 (L) 01/01/2022   MCV 79.0 (L) 01/01/2022   PLT 478 (H) 01/01/2022     Patient is on Vitamin D supplement.   Lab Results  Component Value Date   VD25OH 11 (L) 09/01/2021      Current Medications:  Current Outpatient Medications on File Prior to Visit  Medication Sig Dispense Refill   acetaminophen (TYLENOL) 500 MG tablet Take 500 mg by mouth every 6 (six) hours as needed.     famotidine (PEPCID) 20 MG tablet Take 1 tablet by mouth 2 (two) times daily. PRN     metFORMIN (GLUCOPHAGE) 500 MG tablet Take by mouth 2 (two) times daily with a meal.     norethindrone (MICRONOR) 0.35 MG tablet Take 1 tablet by mouth daily.     Iron-FA-B Cmp-C-Biot-Probiotic (FUSION PLUS PO) Take by mouth. (Patient not taking: Reported on 10/26/2022)     No current facility-administered medications on file prior to visit.   Allergies:  No Known Allergies Medical History:  She has Morbid obesity ([place]); Allergy; Panic attack; Iron deficiency anemia; Prediabetes; PCOS (polycystic ovarian syndrome); Migraines; Major depressive disorder, single episode, moderate with anxious distress ([place]); Diarrhea; Vitamin D deficiency; Mixed hyperlipidemia; and Abnormal glucose on their problem list. Health Maintenance:   Immunization History  Administered Date(s) Administered   Influenza,inj,Quad PF,6+ Mos 04/28/2022   PPD Test 02/17/2019   Pfizer Covid-19 Vaccine Bivalent Booster 58yrs & up 04/28/2022   Td 07/11/2010    Patient Care Team: Unk Pinto, MD as PCP - General (Internal Medicine)  Surgical  History:  She has a past surgical history that includes abcess tooth (2010); wisdom teeth (2009); and Cholecystectomy (10/2011). Family History:  Herfamily history includes Ovarian cancer in her maternal aunt and maternal grandmother. She was adopted. Social History:  She reports that she has never smoked. She has never used smokeless tobacco. She reports current alcohol use. She reports that she does not use drugs.  Review of Systems: Review of Systems  Constitutional:  Negative for chills and fever.       Weight gain  HENT:  Negative for congestion, hearing loss, sinus pain, sore throat and tinnitus.   Eyes:  Negative for blurred vision and double vision.  Respiratory:  Negative for cough, hemoptysis, sputum production, shortness of breath and wheezing.   Cardiovascular:  Negative for chest pain, palpitations and leg swelling.  Gastrointestinal:  Negative for abdominal pain, constipation, diarrhea, heartburn, nausea and vomiting.  Genitourinary:  Negative for dysuria and urgency.       Irregular periods  Musculoskeletal:  Negative for back pain,  falls, joint pain, myalgias and neck pain.  Skin:  Negative for rash.  Neurological:  Positive for headaches. Negative for dizziness, tingling, tremors and weakness.  Endo/Heme/Allergies:  Does not bruise/bleed easily.  Psychiatric/Behavioral:  Negative for depression and suicidal ideas. The patient is not nervous/anxious and does not have insomnia.     Physical Exam: Estimated body mass index is 44.18 kg/m as calculated from the following:   Height as of this encounter: 5' (1.524 m).   Weight as of this encounter: 226 lb 3.2 oz (102.6 kg). BP 122/84   Pulse 87   Temp 98.4 F (36.9 C)   Ht 5' (1.524 m)   Wt 226 lb 3.2 oz (102.6 kg)   LMP 10/20/2022   SpO2 97%   BMI 44.18 kg/m  General Appearance: Very pleasant obese female, in no apparent distress.  Eyes: PERRLA, EOMs, conjunctiva no swelling or erythema, normal fundi and vessels.   Sinuses: No Frontal/maxillary tenderness  ENT/Mouth: Ext aud canals clear, normal light reflex with TMs without erythema, bulging. Good dentition. Tonsils swollen with erythema and white exudate. Hearing normal.  Neck: Supple, thyroid normal. No bruits  Respiratory: Respiratory effort normal, BS equal bilaterally without rales, rhonchi, wheezing or stridor.  Cardio: RRR without murmurs, rubs or gallops. Brisk peripheral pulses without edema.  Chest: symmetric, with normal excursions and percussion.  Breasts: Defer to GYN Abdomen: Positive bowel sounds all 4 quadrants, Soft, nontender, no guarding, rebound, hernias, masses, or organomegaly.  Lymphatics: Non tender without lymphadenopathy.  Genitourinary: defer to GYN Musculoskeletal: Full ROM all peripheral extremities,5/5 strength, and normal gait.  Skin: Warm, dry without rashes, lesions, ecchymosis. Neuro: Cranial nerves intact, reflexes equal bilaterally. Normal muscle tone, no cerebellar symptoms. Sensation intact.  Psych: Awake and oriented X 3, normal affect, Insight and Judgment appropriate.    Teresa Summers E  2:04 PM [facility]

## 2022-10-26 ENCOUNTER — Encounter: Payer: Self-pay | Admitting: Nurse Practitioner

## 2022-10-26 ENCOUNTER — Ambulatory Visit (INDEPENDENT_AMBULATORY_CARE_PROVIDER_SITE_OTHER): Payer: BC Managed Care – PPO | Admitting: Nurse Practitioner

## 2022-10-26 VITALS — BP 122/84 | HR 87 | Temp 98.4°F | Ht 60.0 in | Wt 226.2 lb

## 2022-10-26 DIAGNOSIS — Z0000 Encounter for general adult medical examination without abnormal findings: Secondary | ICD-10-CM

## 2022-10-26 DIAGNOSIS — D509 Iron deficiency anemia, unspecified: Secondary | ICD-10-CM

## 2022-10-26 DIAGNOSIS — E282 Polycystic ovarian syndrome: Secondary | ICD-10-CM

## 2022-10-26 DIAGNOSIS — Z79899 Other long term (current) drug therapy: Secondary | ICD-10-CM

## 2022-10-26 DIAGNOSIS — F321 Major depressive disorder, single episode, moderate: Secondary | ICD-10-CM

## 2022-10-26 DIAGNOSIS — R7309 Other abnormal glucose: Secondary | ICD-10-CM

## 2022-10-26 DIAGNOSIS — Z1329 Encounter for screening for other suspected endocrine disorder: Secondary | ICD-10-CM

## 2022-10-26 DIAGNOSIS — Z13220 Encounter for screening for lipoid disorders: Secondary | ICD-10-CM

## 2022-10-26 DIAGNOSIS — E782 Mixed hyperlipidemia: Secondary | ICD-10-CM

## 2022-10-26 DIAGNOSIS — Z0001 Encounter for general adult medical examination with abnormal findings: Secondary | ICD-10-CM

## 2022-10-26 DIAGNOSIS — Z86711 Personal history of pulmonary embolism: Secondary | ICD-10-CM

## 2022-10-26 DIAGNOSIS — Z1389 Encounter for screening for other disorder: Secondary | ICD-10-CM | POA: Diagnosis not present

## 2022-10-26 DIAGNOSIS — E559 Vitamin D deficiency, unspecified: Secondary | ICD-10-CM | POA: Diagnosis not present

## 2022-10-26 DIAGNOSIS — Z131 Encounter for screening for diabetes mellitus: Secondary | ICD-10-CM | POA: Diagnosis not present

## 2022-10-26 DIAGNOSIS — G43009 Migraine without aura, not intractable, without status migrainosus: Secondary | ICD-10-CM

## 2022-10-26 MED ORDER — SEMAGLUTIDE(0.25 OR 0.5MG/DOS) 2 MG/3ML ~~LOC~~ SOPN: 0.5000 mg | PEN_INJECTOR | SUBCUTANEOUS | 1 refills | Status: DC

## 2022-10-26 NOTE — Patient Instructions (Signed)
Semaglutide Injection What is this medication? SEMAGLUTIDE (SEM a GLOO tide) treats type 2 diabetes. It works by increasing insulin levels in your body, which decreases your blood sugar (glucose). It also reduces the amount of sugar released into the blood and slows down your digestion. It can also be used to lower the risk of heart attack and stroke in people with type 2 diabetes. Changes to diet and exercise are often combined with this medication. This medicine may be used for other purposes; ask your health care provider or pharmacist if you have questions. COMMON BRAND NAME(S): OZEMPIC What should I tell my care team before I take this medication? They need to know if you have any of these conditions: Endocrine tumors (MEN 2) or if someone in your family had these tumors Eye disease, vision problems History of pancreatitis Kidney disease Stomach problems Thyroid cancer or if someone in your family had thyroid cancer An unusual or allergic reaction to semaglutide, other medications, foods, dyes, or preservatives Pregnant or trying to get pregnant Breast-feeding How should I use this medication? This medication is for injection under the skin of your upper leg (thigh), stomach area, or upper arm. It is given once every week (every 7 days). You will be taught how to prepare and give this medication. Use exactly as directed. Take your medication at regular intervals. Do not take it more often than directed. If you use this medication with insulin, you should inject this medication and the insulin separately. Do not mix them together. Do not give the injections right next to each other. Change (rotate) injection sites with each injection. It is important that you put your used needles and syringes in a special sharps container. Do not put them in a trash can. If you do not have a sharps container, call your pharmacist or care team to get one. A special MedGuide will be given to you by the  pharmacist with each prescription and refill. Be sure to read this information carefully each time. This medication comes with INSTRUCTIONS FOR USE. Ask your pharmacist for directions on how to use this medication. Read the information carefully. Talk to your pharmacist or care team if you have questions. Talk to your care team about the use of this medication in children. Special care may be needed. Overdosage: If you think you have taken too much of this medicine contact a poison control center or emergency room at once. NOTE: This medicine is only for you. Do not share this medicine with others. What if I miss a dose? If you miss a dose, take it as soon as you can within 5 days after the missed dose. Then take your next dose at your regular weekly time. If it has been longer than 5 days after the missed dose, do not take the missed dose. Take the next dose at your regular time. Do not take double or extra doses. If you have questions about a missed dose, contact your care team for advice. What may interact with this medication? Other medications for diabetes Many medications may cause changes in blood sugar, these include: Alcohol containing beverages Antiviral medications for HIV or AIDS Aspirin and aspirin-like medications Certain medications for blood pressure, heart disease, irregular heart beat Chromium Diuretics Female hormones, such as estrogens or progestins, birth control pills Fenofibrate Gemfibrozil Isoniazid Lanreotide Female hormones or anabolic steroids MAOIs like Carbex, Eldepryl, Marplan, Nardil, and Parnate Medications for weight loss Medications for allergies, asthma, cold, or cough Medications for depression,  anxiety, or psychotic disturbances Niacin Nicotine NSAIDs, medications for pain and inflammation, like ibuprofen or naproxen Octreotide Pasireotide Pentamidine Phenytoin Probenecid Quinolone antibiotics such as ciprofloxacin, levofloxacin, ofloxacin Some  herbal dietary supplements Steroid medications such as prednisone or cortisone Sulfamethoxazole; trimethoprim Thyroid hormones Some medications can hide the warning symptoms of low blood sugar (hypoglycemia). You may need to monitor your blood sugar more closely if you are taking one of these medications. These include: Beta-blockers, often used for high blood pressure or heart problems (examples include atenolol, metoprolol, propranolol) Clonidine Guanethidine Reserpine This list may not describe all possible interactions. Give your health care provider a list of all the medicines, herbs, non-prescription drugs, or dietary supplements you use. Also tell them if you smoke, drink alcohol, or use illegal drugs. Some items may interact with your medicine. What should I watch for while using this medication? Visit your care team for regular checks on your progress. Drink plenty of fluids while taking this medication. Check with your care team if you get an attack of severe diarrhea, nausea, and vomiting. The loss of too much body fluid can make it dangerous for you to take this medication. A test called the HbA1C (A1C) will be monitored. This is a simple blood test. It measures your blood sugar control over the last 2 to 3 months. You will receive this test every 3 to 6 months. Learn how to check your blood sugar. Learn the symptoms of low and high blood sugar and how to manage them. Always carry a quick-source of sugar with you in case you have symptoms of low blood sugar. Examples include hard sugar candy or glucose tablets. Make sure others know that you can choke if you eat or drink when you develop serious symptoms of low blood sugar, such as seizures or unconsciousness. They must get medical help at once. Tell your care team if you have high blood sugar. You might need to change the dose of your medication. If you are sick or exercising more than usual, you might need to change the dose of your  medication. Do not skip meals. Ask your care team if you should avoid alcohol. Many nonprescription cough and cold products contain sugar or alcohol. These can affect blood sugar. Pens should never be shared. Even if the needle is changed, sharing may result in passing of viruses like hepatitis or HIV. Wear a medical ID bracelet or chain, and carry a card that describes your disease and details of your medication and dosage times. Do not become pregnant while taking this medication. Women should inform their care team if they wish to become pregnant or think they might be pregnant. There is a potential for serious side effects to an unborn child. Talk to your care team for more information. What side effects may I notice from receiving this medication? Side effects that you should report to your care team as soon as possible: Allergic reactions--skin rash, itching, hives, swelling of the face, lips, tongue, or throat Change in vision Dehydration--increased thirst, dry mouth, feeling faint or lightheaded, headache, dark yellow or brown urine Gallbladder problems--severe stomach pain, nausea, vomiting, fever Heart palpitations--rapid, pounding, or irregular heartbeat Kidney injury--decrease in the amount of urine, swelling of the ankles, hands, or feet Pancreatitis--severe stomach pain that spreads to your back or gets worse after eating or when touched, fever, nausea, vomiting Thyroid cancer--new mass or lump in the neck, pain or trouble swallowing, trouble breathing, hoarseness Side effects that usually do not require medical  attention (report to your care team if they continue or are bothersome): Diarrhea Loss of appetite Nausea Stomach pain Vomiting This list may not describe all possible side effects. Call your doctor for medical advice about side effects. You may report side effects to FDA at 1-800-FDA-1088. Where should I keep my medication? Keep out of the reach of children. Store  unopened pens in a refrigerator between 2 and 8 degrees C (36 and 46 degrees F). Do not freeze. Protect from light and heat. After you first use the pen, it can be stored for 56 days at room temperature between 15 and 30 degrees C (59 and 86 degrees F) or in a refrigerator. Throw away your used pen after 56 days or after the expiration date, whichever comes first. Do not store your pen with the needle attached. If the needle is left on, medication may leak from the pen. NOTE: This sheet is a summary. It may not cover all possible information. If you have questions about this medicine, talk to your doctor, pharmacist, or health care provider.  2023 Elsevier/Gold Standard (2020-11-21 00:00:00)  

## 2022-10-27 ENCOUNTER — Encounter: Payer: Self-pay | Admitting: Nurse Practitioner

## 2022-10-28 LAB — COMPLETE METABOLIC PANEL WITH GFR
AG Ratio: 1.2 (calc) (ref 1.0–2.5)
ALT: 13 U/L (ref 6–29)
AST: 13 U/L (ref 10–30)
Albumin: 4.1 g/dL (ref 3.6–5.1)
Alkaline phosphatase (APISO): 69 U/L (ref 31–125)
BUN: 13 mg/dL (ref 7–25)
CO2: 24 mmol/L (ref 20–32)
Calcium: 9.8 mg/dL (ref 8.6–10.2)
Chloride: 104 mmol/L (ref 98–110)
Creat: 0.56 mg/dL (ref 0.50–0.96)
Globulin: 3.4 g/dL (calc) (ref 1.9–3.7)
Glucose, Bld: 79 mg/dL (ref 65–99)
Potassium: 4.3 mmol/L (ref 3.5–5.3)
Sodium: 137 mmol/L (ref 135–146)
Total Bilirubin: 0.4 mg/dL (ref 0.2–1.2)
Total Protein: 7.5 g/dL (ref 6.1–8.1)
eGFR: 127 mL/min/{1.73_m2} (ref >=60)

## 2022-10-28 LAB — CBC WITH DIFFERENTIAL/PLATELET
Absolute Monocytes: 594 cells/uL (ref 200–950)
Basophils Absolute: 65 cells/uL (ref 0–200)
Basophils Relative: 0.6 %
Eosinophils Absolute: 151 cells/uL (ref 15–500)
Eosinophils Relative: 1.4 %
HCT: 38.4 % (ref 35.0–45.0)
Hemoglobin: 12.8 g/dL (ref 11.7–15.5)
Lymphs Abs: 3629 cells/uL (ref 850–3900)
MCH: 26.8 pg — ABNORMAL LOW (ref 27.0–33.0)
MCHC: 33.3 g/dL (ref 32.0–36.0)
MCV: 80.5 fL (ref 80.0–100.0)
MPV: 8.7 fL (ref 7.5–12.5)
Monocytes Relative: 5.5 %
Neutro Abs: 6361 cells/uL (ref 1500–7800)
Neutrophils Relative %: 58.9 %
Platelets: 479 10*3/uL — ABNORMAL HIGH (ref 140–400)
RBC: 4.77 10*6/uL (ref 3.80–5.10)
RDW: 14.4 % (ref 11.0–15.0)
Total Lymphocyte: 33.6 %
WBC: 10.8 10*3/uL (ref 3.8–10.8)

## 2022-10-28 LAB — URINALYSIS W MICROSCOPIC + REFLEX CULTURE
Bilirubin Urine: NEGATIVE
Glucose, UA: NEGATIVE
Hyaline Cast: NONE SEEN /LPF
Ketones, ur: NEGATIVE
Nitrites, Initial: NEGATIVE
Protein, ur: NEGATIVE
Specific Gravity, Urine: 1.026 (ref 1.001–1.035)
pH: 5.5 (ref 5.0–8.0)

## 2022-10-28 LAB — LIPID PANEL
Cholesterol: 245 mg/dL — ABNORMAL HIGH (ref <200)
HDL: 66 mg/dL (ref >=50)
LDL Cholesterol (Calc): 159 mg/dL (calc) — ABNORMAL HIGH
Non-HDL Cholesterol (Calc): 179 mg/dL (calc) — ABNORMAL HIGH (ref <130)
Total CHOL/HDL Ratio: 3.7 (calc) (ref <5.0)
Triglycerides: 94 mg/dL (ref <150)

## 2022-10-28 LAB — VITAMIN D 25 HYDROXY (VIT D DEFICIENCY, FRACTURES): Vit D, 25-Hydroxy: 13 ng/mL — ABNORMAL LOW (ref 30–100)

## 2022-10-28 LAB — URINE CULTURE
MICRO NUMBER:: 14522556
Result:: NO GROWTH
SPECIMEN QUALITY:: ADEQUATE

## 2022-10-28 LAB — MAGNESIUM: Magnesium: 1.8 mg/dL (ref 1.5–2.5)

## 2022-10-28 LAB — HEMOGLOBIN A1C
Hgb A1c MFr Bld: 5.6 % of total Hgb (ref <5.7)
Mean Plasma Glucose: 114 mg/dL
eAG (mmol/L): 6.3 mmol/L

## 2022-10-28 LAB — MICROALBUMIN / CREATININE URINE RATIO
Creatinine, Urine: 180 mg/dL (ref 20–275)
Microalb Creat Ratio: 6 mcg/mg creat (ref <30)
Microalb, Ur: 1 mg/dL

## 2022-10-28 LAB — TSH: TSH: 1.58 mIU/L

## 2022-10-28 LAB — CULTURE INDICATED

## 2022-11-04 ENCOUNTER — Encounter: Payer: Self-pay | Admitting: Nurse Practitioner

## 2022-12-09 NOTE — Progress Notes (Deleted)
Assessment and Plan:  There are no diagnoses linked to this encounter.    Further disposition pending results of labs. Discussed med's effects and SE's.   Over 30 minutes of exam, counseling, chart review, and critical decision making was performed.   Future Appointments  Date Time Provider [place]  12/10/2022 11:00 AM Alycia Rossetti, NP [facility] None  01/26/2023  2:45 PM Alycia Rossetti, NP [facility] None  10/20/2023  2:00 PM Alycia Rossetti, NP [facility] None    ------------------------------------------------------------------------------------------------------------------   HPI There were no vitals taken for this visit. 32 y.o.female presents for  Past Medical History:  Diagnosis Date   Allergy    Asthma    Obesity      No Known Allergies  Current Outpatient Medications on File Prior to Visit  Medication Sig   acetaminophen (TYLENOL) 500 MG tablet Take 500 mg by mouth every 6 (six) hours as needed.   famotidine (PEPCID) 20 MG tablet Take 1 tablet by mouth 2 (two) times daily. PRN   Iron-FA-B Cmp-C-Biot-Probiotic (FUSION PLUS PO) Take by mouth. (Patient not taking: Reported on 10/26/2022)   metFORMIN (GLUCOPHAGE) 500 MG tablet Take by mouth 2 (two) times daily with a meal.   norethindrone (MICRONOR) 0.35 MG tablet Take 1 tablet by mouth daily.   Semaglutide,0.25 or 0.5MG /DOS, 2 MG/3ML SOPN Inject 0.5 mg into the skin once a week.   No current facility-administered medications on file prior to visit.    ROS: all negative except above.   Physical Exam:  There were no vitals taken for this visit.  General Appearance: Well nourished, in no apparent distress. Eyes: PERRLA, EOMs, conjunctiva no swelling or erythema Sinuses: No Frontal/maxillary tenderness ENT/Mouth: Ext aud canals clear, TMs without erythema, bulging. No erythema, swelling, or exudate on post pharynx.  Tonsils not swollen or erythematous. Hearing normal.  Neck: Supple, thyroid  normal.  Respiratory: Respiratory effort normal, BS equal bilaterally without rales, rhonchi, wheezing or stridor.  Cardio: RRR with no MRGs. Brisk peripheral pulses without edema.  Abdomen: Soft, + BS.  Non tender, no guarding, rebound, hernias, masses. Lymphatics: Non tender without lymphadenopathy.  Musculoskeletal: Full ROM, 5/5 strength, normal gait.  Skin: Warm, dry without rashes, lesions, ecchymosis.  Neuro: Cranial nerves intact. Normal muscle tone, no cerebellar symptoms. Sensation intact.  Psych: Awake and oriented X 3, normal affect, Insight and Judgment appropriate.     Alycia Rossetti, NP 9:28 AM [facility]

## 2022-12-10 ENCOUNTER — Ambulatory Visit: Payer: BC Managed Care – PPO | Admitting: Nurse Practitioner

## 2022-12-14 NOTE — Progress Notes (Unsigned)
Hospital follow up  Assessment and Plan: Hospital visit follow up for:    Olamae was seen today for er follow up.  Diagnoses and all orders for this visit:  Chest pain of uncertain etiology Physical exam revealed no further answers from ER visit, Will get CT to r.o PE Should continue to follow with cardiology -     CT Angio Chest W/Cm &/Or Wo Cm; Future  Anxiety Continue diet and exercise Relaxation techniques Declines medication  Mixed hyperlipidemia Continue diet, exercise and weight loss  History of pulmonary embolism Will get repeat CT  -     CT Angio Chest W/Cm &/Or Wo Cm; Future  Morbid obesity ([place]) Fair life protein shakes Eat more frequently - try not to go more than 6 hours without protein Aim for 90 grams of protein a day- 30 breakfast/30 lunch 30 dinner Try to keep net carbs less than 50 Net Carbs=Total Carbs-fiber- sugar alcohols Exercise heartrate 120-140(fat burning zone)- walking 20-30 minutes 4 days a week  -     metFORMIN (GLUCOPHAGE) 500 MG tablet; Take 2 tabs twice a day with meals  Major depressive disorder, single episode, moderate with anxious distress (HCC) Currently on no medication and feels her symptoms are well controlled  PCOS (polycystic ovarian syndrome)/Abnormal Glucose Continue diet, exercise and weight loss -     metFORMIN (GLUCOPHAGE) 500 MG tablet; Take 2 tabs twice a day with meals       All medications were reviewed with patient and family and fully reconciled. All questions answered fully, and patient and family members were encouraged to call the office with any further questions or concerns. Discussed goal to avoid readmission related to this diagnosis.  Medications Discontinued During This Encounter  Medication Reason   metFORMIN (GLUCOPHAGE) 500 MG tablet Reorder   Over 40 minutes of exam, counseling, chart review, and complex, high/moderate level critical decision making was performed this visit.   Future Appointments   Date Time Provider [place]  01/26/2023  2:45 PM Alycia Rossetti, NP [facility] None  10/20/2023  2:00 PM Alycia Rossetti, NP [facility] None     HPI 32 y.o.female presents for follow up from ER visit on 12/04/22  Well controlled without medication BP Readings from Last 3 Encounters:  12/15/22 112/78  10/26/22 122/84  03/26/22 118/80   BMI is Body mass index is 44.53 kg/m., she has been working on diet and exercise. Wt Readings from Last 3 Encounters:  12/15/22 228 lb (103.4 kg)  10/26/22 226 lb 3.2 oz (102.6 kg)  03/26/22 213 lb 6.4 oz (96.8 kg)     Delissa-Tiffany Temya Breitenstein is sa 32 y/o female with history of obesity, PCOS, asthma, dyslipidemia, mild anemia, and pulmonary embolism (CTA with possible filling defect within a subsegmental pulmonary artery branch supplying the lingula) in Feb 2023, deemed provoked by combination OCPs, s/p Xarelto x 3 months. She is now presenting with about 6 days of chest pain. The chest pain is described as shooting pains around her sternum, associated with mild shortness of breath as well as fatigue. She has been experiencing chest pain of the same character on and off since the time of her PE diagnosis. She has been following with cardiologist for this and had extensive workup, including the following: -Normal TTE with EF 65-70% and no valve abnormalities Feb 2023. -Repeat CTA PE study with no pulmonary emboli identified 02/23/2022 -Zio patch 03/14/2022 with sinus rhythm, rare PAC/PVC. -Negative stress EKG 04/28/2022  EKG obtained which revealed  sinus tachycardia to the low 100's, no ischemic changes. CXR unremarkable. PE unlikely given negative D-dimer. ACS unlikely given recent negative stress testing and troponin of <3. Patient denies reflux symptoms and noted her cardiologist trailed her on a PPI with no improvement in symptoms previously. No recent illness, no cough/sore throat/rhinorrhea/congestion, COVID/flu testing negative. No  chest wall tenderness appreciated. Possible precordial catch. CBC notable for chronic mild leukocytosis / thrombocytosis - patient to follow this up with PCP.  Patient to follow up with PCP / cardiologist. All questions answered, patient in agreement with plan. Return precautions reviewed.    Continues to have chest tightness, pressure most of the day and will have intermittent shooting stabbing pain. Will occasionally get foggy feeling in her head. She is concerned because CXR showed pulmonary edema.  Last time she had PE it was only discovered with CT. Strongly denies any feeling of anxiety and does not wish to try possible anti-anxiety medication     Current Outpatient Medications (Endocrine & Metabolic):    norethindrone (MICRONOR) 0.35 MG tablet, Take 1 tablet by mouth daily.   metFORMIN (GLUCOPHAGE) 500 MG tablet, Take 2 tabs twice a day with meals   Semaglutide,0.25 or 0.5MG /DOS, 2 MG/3ML SOPN, Inject 0.5 mg into the skin once a week. (Patient not taking: Reported on 12/15/2022)    Current Outpatient Medications (Analgesics):    acetaminophen (TYLENOL) 500 MG tablet, Take 500 mg by mouth every 6 (six) hours as needed.  Current Outpatient Medications (Hematological):    Iron-FA-B Cmp-C-Biot-Probiotic (FUSION PLUS PO), Take by mouth.  Current Outpatient Medications (Other):    famotidine (PEPCID) 20 MG tablet, Take 1 tablet by mouth 2 (two) times daily. PRN (Patient not taking: Reported on 12/15/2022)  Past Medical History:  Diagnosis Date   Allergy    Asthma    Obesity      No Known Allergies  ROS: all negative except above.   Physical Exam: Filed Weights   12/15/22 1025  Weight: 228 lb (103.4 kg)   BP 112/78   Pulse 95   Temp 97.7 F (36.5 C)   Ht 5' (1.524 m)   Wt 228 lb (103.4 kg)   LMP 11/20/2022   SpO2 98%   BMI 44.53 kg/m  General Appearance: Morbidly obese pleasant female, appears slightly anxious. Eyes: PERRLA, EOMs, conjunctiva no swelling or  erythema Sinuses: No Frontal/maxillary tenderness ENT/Mouth: Ext aud canals clear, TMs without erythema, bulging. No erythema, swelling, or exudate on post pharynx. Hearing normal.  Neck: Supple, thyroid normal.  Respiratory: Respiratory effort normal, BS equal bilaterally without rales, rhonchi, wheezing or stridor.  Cardio: RRR with no MRGs. Brisk peripheral pulses without edema.  Abdomen: Soft, + BS.  Non tender, no guarding, rebound, hernias, masses. Lymphatics: Non tender without lymphadenopathy.  Musculoskeletal: Full ROM, 5/5 strength, normal gait.  Skin: Warm, dry without rashes, lesions, ecchymosis.  Neuro: Cranial nerves intact. Normal muscle tone, no cerebellar symptoms. Sensation intact.  Psych: Awake and oriented X 3, normal affect, Insight and Judgment appropriate.     Alycia Rossetti, NP 11:59 AM [facility]

## 2022-12-15 ENCOUNTER — Encounter: Payer: Self-pay | Admitting: Nurse Practitioner

## 2022-12-15 ENCOUNTER — Ambulatory Visit (INDEPENDENT_AMBULATORY_CARE_PROVIDER_SITE_OTHER): Payer: BC Managed Care – PPO | Admitting: Nurse Practitioner

## 2022-12-15 VITALS — BP 112/78 | HR 95 | Temp 97.7°F | Ht 60.0 in | Wt 228.0 lb

## 2022-12-15 DIAGNOSIS — Z86711 Personal history of pulmonary embolism: Secondary | ICD-10-CM

## 2022-12-15 DIAGNOSIS — E782 Mixed hyperlipidemia: Secondary | ICD-10-CM | POA: Diagnosis not present

## 2022-12-15 DIAGNOSIS — F419 Anxiety disorder, unspecified: Secondary | ICD-10-CM | POA: Diagnosis not present

## 2022-12-15 DIAGNOSIS — R079 Chest pain, unspecified: Secondary | ICD-10-CM

## 2022-12-15 DIAGNOSIS — E282 Polycystic ovarian syndrome: Secondary | ICD-10-CM

## 2022-12-15 DIAGNOSIS — F321 Major depressive disorder, single episode, moderate: Secondary | ICD-10-CM

## 2022-12-15 DIAGNOSIS — R7309 Other abnormal glucose: Secondary | ICD-10-CM

## 2022-12-15 MED ORDER — METFORMIN HCL 500 MG PO TABS: ORAL_TABLET | ORAL | 1 refills | Status: DC

## 2022-12-15 NOTE — Patient Instructions (Signed)
Nonspecific Chest Pain Chest pain can be caused by many different conditions. Some causes of chest pain can be life-threatening. These will require treatment right away. Serious causes of chest pain include: Heart attack. A tear in the body's main blood vessel. Redness and swelling (inflammation) around your heart. Blood clot in your lungs. Other causes of chest pain may not be so serious. These include: Heartburn. Anxiety or stress. Damage to bones or muscles in your chest. Lung infections. Chest pain can feel like: Pain or discomfort in your chest. Crushing, pressure, aching, or squeezing pain. Burning or tingling. Dull or sharp pain that is worse when you move, cough, or take a deep breath. Pain or discomfort that is also felt in your back, neck, jaw, shoulder, or arm, or pain that spreads to any of these areas. It is hard to know whether your pain is caused by something that is serious or something that is not so serious. So it is important to see your doctor right away if you have chest pain. Follow these instructions at home: Medicines Take over-the-counter and prescription medicines only as told by your doctor. If you were prescribed an antibiotic medicine, take it as told by your doctor. Do not stop taking the antibiotic even if you start to feel better. Lifestyle  Rest as told by your doctor. Do not use any products that contain nicotine or tobacco, such as cigarettes, e-cigarettes, and chewing tobacco. If you need help quitting, ask your doctor. Do not drink alcohol. Make lifestyle changes as told by your doctor. These may include: Getting regular exercise. Ask your doctor what activities are safe for you. Eating a heart-healthy diet. A diet and nutrition specialist (dietitian) can help you to learn healthy eating options. Staying at a healthy weight. Treating diabetes or high blood pressure, if needed. Lowering your stress. Activities such as yoga and relaxation techniques  can help. General instructions Pay attention to any changes in your symptoms. Tell your doctor about them or any new symptoms. Avoid any activities that cause chest pain. Keep all follow-up visits as told by your doctor. This is important. You may need more testing if your chest pain does not go away. Contact a doctor if: Your chest pain does not go away. You feel depressed. You have a fever. Get help right away if: Your chest pain is worse. You have a cough that gets worse, or you cough up blood. You have very bad (severe) pain in your belly (abdomen). You pass out (faint). You have either of these for no clear reason: Sudden chest discomfort. Sudden discomfort in your arms, back, neck, or jaw. You have shortness of breath at any time. You suddenly start to sweat, or your skin gets clammy. You feel sick to your stomach (nauseous). You throw up (vomit). You suddenly feel lightheaded or dizzy. You feel very weak or tired. Your heart starts to beat fast, or it feels like it is skipping beats. These symptoms may be an emergency. Do not wait to see if the symptoms will go away. Get medical help right away. Call your local emergency services (911 in the [place] Do not drive yourself to the hospital. Summary Chest pain can be caused by many different conditions. The cause may be serious and need treatment right away. If you have chest pain, see your doctor right away. Follow your doctor's instructions for taking medicines and making lifestyle changes. Keep all follow-up visits as told by your doctor. This includes visits for any further   testing if your chest pain does not go away. Be sure to know the signs that show that your condition has become worse. Get help right away if you have these symptoms. This information is not intended to replace advice given to you by your health care provider. Make sure you discuss any questions you have with your health care provider. Document Revised:  07/23/2022 Document Reviewed: 07/23/2022 Else[employer] Patient Education  2023 [employer]  

## 2022-12-21 ENCOUNTER — Encounter: Payer: Self-pay | Admitting: Nurse Practitioner

## 2022-12-21 ENCOUNTER — Other Ambulatory Visit: Payer: Self-pay | Admitting: Nurse Practitioner

## 2022-12-21 DIAGNOSIS — R9389 Abnormal findings on diagnostic imaging of other specified body structures: Secondary | ICD-10-CM

## 2023-01-01 ENCOUNTER — Other Ambulatory Visit: Payer: Self-pay | Admitting: Nurse Practitioner

## 2023-01-01 DIAGNOSIS — E282 Polycystic ovarian syndrome: Secondary | ICD-10-CM

## 2023-01-01 MED ORDER — METFORMIN HCL ER 500 MG PO TB24: ORAL_TABLET | ORAL | 3 refills | Status: DC

## 2023-01-26 ENCOUNTER — Ambulatory Visit: Payer: Self-pay | Admitting: Nurse Practitioner

## 2023-02-20 ENCOUNTER — Other Ambulatory Visit: Payer: Self-pay | Admitting: Nurse Practitioner

## 2023-02-20 DIAGNOSIS — E282 Polycystic ovarian syndrome: Secondary | ICD-10-CM

## 2023-03-11 ENCOUNTER — Encounter: Payer: Self-pay | Admitting: Internal Medicine

## 2023-04-14 ENCOUNTER — Encounter: Payer: Self-pay | Admitting: Internal Medicine

## 2023-06-28 ENCOUNTER — Other Ambulatory Visit: Payer: Self-pay | Admitting: Nurse Practitioner

## 2023-06-28 DIAGNOSIS — E282 Polycystic ovarian syndrome: Secondary | ICD-10-CM

## 2023-06-28 NOTE — Telephone Encounter (Signed)
Please advise patient I cannot prescribe drugs because it states she has medicaid.  Can you please verify if this is true.

## 2023-07-23 NOTE — Progress Notes (Unsigned)
Not seen- has medicaid

## 2023-07-26 ENCOUNTER — Ambulatory Visit: Payer: Self-pay | Admitting: Nurse Practitioner

## 2023-07-26 DIAGNOSIS — E282 Polycystic ovarian syndrome: Secondary | ICD-10-CM

## 2023-07-26 DIAGNOSIS — D509 Iron deficiency anemia, unspecified: Secondary | ICD-10-CM

## 2023-07-26 DIAGNOSIS — Z79899 Other long term (current) drug therapy: Secondary | ICD-10-CM

## 2023-07-26 DIAGNOSIS — Z86711 Personal history of pulmonary embolism: Secondary | ICD-10-CM

## 2023-07-26 DIAGNOSIS — G43009 Migraine without aura, not intractable, without status migrainosus: Secondary | ICD-10-CM

## 2023-07-26 DIAGNOSIS — R7309 Other abnormal glucose: Secondary | ICD-10-CM

## 2023-07-26 DIAGNOSIS — F419 Anxiety disorder, unspecified: Secondary | ICD-10-CM

## 2023-07-26 DIAGNOSIS — F321 Major depressive disorder, single episode, moderate: Secondary | ICD-10-CM

## 2023-07-26 DIAGNOSIS — E782 Mixed hyperlipidemia: Secondary | ICD-10-CM

## 2023-07-26 DIAGNOSIS — Z0001 Encounter for general adult medical examination with abnormal findings: Secondary | ICD-10-CM

## 2023-09-10 ENCOUNTER — Encounter: Payer: Self-pay | Admitting: Nurse Practitioner

## 2023-10-20 ENCOUNTER — Encounter: Payer: Self-pay | Admitting: Nurse Practitioner
